# Patient Record
Sex: Female | Born: 1940
Health system: Southern US, Community
[De-identification: ages and names within clinical notes are randomized; demographics above are authoritative.]

## PROBLEM LIST (undated history)

## (undated) DIAGNOSIS — D219 Benign neoplasm of connective and other soft tissue, unspecified: Secondary | ICD-10-CM

## (undated) DIAGNOSIS — K635 Polyp of colon: Secondary | ICD-10-CM

## (undated) DIAGNOSIS — K5790 Diverticulosis of intestine, part unspecified, without perforation or abscess without bleeding: Secondary | ICD-10-CM

## (undated) DIAGNOSIS — D126 Benign neoplasm of colon, unspecified: Secondary | ICD-10-CM

## (undated) DIAGNOSIS — Z9889 Other specified postprocedural states: Secondary | ICD-10-CM

## (undated) DIAGNOSIS — R112 Nausea with vomiting, unspecified: Secondary | ICD-10-CM

## (undated) DIAGNOSIS — I639 Cerebral infarction, unspecified: Secondary | ICD-10-CM

## (undated) DIAGNOSIS — M810 Age-related osteoporosis without current pathological fracture: Secondary | ICD-10-CM

## (undated) HISTORY — DX: Age-related osteoporosis without current pathological fracture: M81.0

## (undated) HISTORY — PX: UTERINE FIBROID SURGERY: SHX826

## (undated) HISTORY — DX: Diverticulosis of intestine, part unspecified, without perforation or abscess without bleeding: K57.90

## (undated) HISTORY — PX: NASAL SEPTUM SURGERY: SHX37

## (undated) HISTORY — DX: Polyp of colon: K63.5

## (undated) HISTORY — PX: FACIAL COSMETIC SURGERY: SHX629

## (undated) HISTORY — DX: Cerebral infarction, unspecified: I63.9

## (undated) HISTORY — DX: Benign neoplasm of colon, unspecified: D12.6

## (undated) HISTORY — PX: SQUAMOUS CELL CARCINOMA EXCISION: SHX2433

---

## 1998-04-16 ENCOUNTER — Other Ambulatory Visit: Admission: RE | Admit: 1998-04-16 | Discharge: 1998-04-16 | Payer: Self-pay | Admitting: Obstetrics and Gynecology

## 1999-05-19 ENCOUNTER — Other Ambulatory Visit: Admission: RE | Admit: 1999-05-19 | Discharge: 1999-05-19 | Payer: Self-pay | Admitting: Obstetrics and Gynecology

## 2000-06-13 ENCOUNTER — Other Ambulatory Visit: Admission: RE | Admit: 2000-06-13 | Discharge: 2000-06-13 | Payer: Self-pay | Admitting: Obstetrics and Gynecology

## 2001-06-19 ENCOUNTER — Other Ambulatory Visit: Admission: RE | Admit: 2001-06-19 | Discharge: 2001-06-19 | Payer: Self-pay | Admitting: Obstetrics and Gynecology

## 2002-06-07 DIAGNOSIS — D126 Benign neoplasm of colon, unspecified: Secondary | ICD-10-CM

## 2002-06-07 DIAGNOSIS — K635 Polyp of colon: Secondary | ICD-10-CM

## 2002-06-07 HISTORY — DX: Benign neoplasm of colon, unspecified: D12.6

## 2002-06-07 HISTORY — DX: Polyp of colon: K63.5

## 2002-07-31 ENCOUNTER — Other Ambulatory Visit: Admission: RE | Admit: 2002-07-31 | Discharge: 2002-07-31 | Payer: Self-pay | Admitting: Obstetrics and Gynecology

## 2003-08-05 ENCOUNTER — Other Ambulatory Visit: Admission: RE | Admit: 2003-08-05 | Discharge: 2003-08-05 | Payer: Self-pay | Admitting: Obstetrics and Gynecology

## 2007-05-01 ENCOUNTER — Other Ambulatory Visit: Admission: RE | Admit: 2007-05-01 | Discharge: 2007-05-01 | Payer: Self-pay | Admitting: Internal Medicine

## 2008-07-29 ENCOUNTER — Other Ambulatory Visit: Admission: RE | Admit: 2008-07-29 | Discharge: 2008-07-29 | Payer: Self-pay | Admitting: Internal Medicine

## 2008-07-29 ENCOUNTER — Ambulatory Visit: Payer: Self-pay | Admitting: Internal Medicine

## 2010-01-30 ENCOUNTER — Encounter (INDEPENDENT_AMBULATORY_CARE_PROVIDER_SITE_OTHER): Payer: Self-pay | Admitting: *Deleted

## 2010-03-16 ENCOUNTER — Encounter (INDEPENDENT_AMBULATORY_CARE_PROVIDER_SITE_OTHER): Payer: Self-pay | Admitting: *Deleted

## 2010-03-18 ENCOUNTER — Ambulatory Visit: Payer: Self-pay | Admitting: Internal Medicine

## 2010-03-31 ENCOUNTER — Telehealth (INDEPENDENT_AMBULATORY_CARE_PROVIDER_SITE_OTHER): Payer: Self-pay | Admitting: *Deleted

## 2010-04-01 ENCOUNTER — Ambulatory Visit: Payer: Self-pay | Admitting: Internal Medicine

## 2010-07-07 NOTE — Letter (Signed)
Summary: [facility] Instructions  [facility]  [address] [place], [place] [address]   Phone: [phone]  Fax: [phone]       Vanessa Ford    January 30, 1941    MRN: [id number]       Procedure Day Dorna Bloom:  Vanessa Ford 04/01/2010     Arrival Time: 7:30 am     Procedure Time:  8:30 am     Location of Procedure:                    _ x_   Endoscopy Center (4th Floor)    PREPARATION FOR COLONOSCOPY WITH MIRALAX  Starting 5 days prior to your procedure Friday 10/21 do not eat nuts, seeds, popcorn, corn, beans, peas,  salads, or any raw vegetables.  Do not take any fiber supplements (e.g. Metamucil, Citrucel, and Benefiber). ____________________________________________________________________________________________________   THE DAY BEFORE YOUR PROCEDURE         DATE: Tuesday 10/25  1   Drink clear liquids the entire day-NO SOLID FOOD  2   Do not drink anything colored red or purple.  Avoid juices with pulp.  No orange juice.  3   Drink at least 64 oz. (8 glasses) of fluid/clear liquids during the day to prevent dehydration and help the prep work efficiently.  CLEAR LIQUIDS INCLUDE: Water Jello Ice Popsicles Tea (sugar ok, no milk/cream) Powdered fruit flavored drinks Coffee (sugar ok, no milk/cream) Gatorade Juice: apple, white grape, white cranberry  Lemonade Clear bullion, consomm, broth Carbonated beverages (any kind) Strained chicken noodle soup Hard Candy  4   Mix the entire bottle of Miralax with 64 oz. of Gatorade/Powerade in the morning and put in the refrigerator to chill.  5   At 3:00 pm take 2 Dulcolax/Bisacodyl tablets.  6   At 4:30 pm take one Reglan/Metoclopramide tablet.  7  Starting at 5:00 pm drink one 8 oz glass of the Miralax mixture every 15-20 minutes until you have finished drinking the entire 64 oz.  You should finish drinking prep around 7:30 or 8:00 pm.  8   If you are nauseated, you may take the 2nd Reglan/Metoclopramide  tablet at 6:30 pm.        9    At 8:00 pm take 2 more DULCOLAX/Bisacodyl tablets.     THE DAY OF YOUR PROCEDURE      DATE:  Wednesday 10/26  You may drink clear liquids until 6:30 am (2 HOURS BEFORE PROCEDURE).   MEDICATION INSTRUCTIONS  Unless otherwise instructed, you should take regular prescription medications with a small sip of water as early as possible the morning of your procedure.        OTHER INSTRUCTIONS  You will need a responsible adult at least 70 years of age to accompany you and drive you home.   This person must remain in the waiting room during your procedure.  Wear loose fitting clothing that is easily removed.  Leave jewelry and other valuables at home.  However, you may wish to bring a book to read or an iPod/MP3 player to listen to music as you wait for your procedure to start.  Remove all body piercing jewelry and leave at home.  Total time from sign-in until discharge is approximately 2-3 hours.  You should go home directly after your procedure and rest.  You can resume normal activities the day after your procedure.  The day of your procedure you should not:   Drive   Make legal  decisions   Operate machinery   Drink alcohol   Return to work  You will receive specific instructions about eating, activities and medications before you leave.   The above instructions have been reviewed and explained to me by   Wyona Almas RN  March 18, 2010 11:09 AM     I fully understand and can verbalize these instructions _____________________________ Date _______

## 2010-07-07 NOTE — Procedures (Signed)
Summary: Colonoscopy  Patient: Vanessa Ford Note: All result statuses are Final unless otherwise noted.  Tests: (1) Colonoscopy (COL)   COL Colonoscopy           DONE     [facility]     [address]     [place], [place]  [address]           COLONOSCOPY PROCEDURE REPORT           PATIENT:  [name], [name]  MR#:  [id number]     BIRTHDATE:  24-Jan-1941, 69 yrs. old  GENDER:  female     ENDOSCOPIST:  Hedwig Morton. Juanda Chance, MD     REF. BY:  Creola Corn, M.D.     PROCEDURE DATE:  04/01/2010     PROCEDURE:  Colonoscopy 34742     ASA CLASS:  Class II     INDICATIONS:  Elevated Risk Screening f hx of colon cancer,     adenom. polyp 2004     MEDICATIONS:   Versed 10 mg, Fentanyl 75 mcg           DESCRIPTION OF PROCEDURE:   After the risks benefits and     alternatives of the procedure were thoroughly explained, informed     consent was obtained.  Digital rectal exam was performed and     revealed no rectal masses.   The LB [id number]-[id number] endoscope     was introduced through the anus and advanced to the cecum, which     was identified by both the appendix and ileocecal valve, without     limitations.  The quality of the prep was excellent, using     MiraLax.  The instrument was then slowly withdrawn as the colon     was fully examined.     <<PROCEDUREIMAGES>>           FINDINGS:  Moderate diverticulosis was found (see image1 and     image7). tortuous sigmoid colon, difficult exam,  This was     otherwise a normal examination of the colon (see image8, image6,     image5, and image3).   Retroflexed views in the rectum revealed no     abnormalities.    The scope was then withdrawn from the patient     and the procedure completed.           COMPLICATIONS:  None     ENDOSCOPIC IMPRESSION:     1) Moderate diverticulosis     2) Otherwise normal examination     RECOMMENDATIONS:     1) high fiber diet     REPEAT EXAM:  In 5 year(s) for.           ______________________________     Hedwig Morton. Juanda Chance, MD           CC:           n.     eSIGNED:   Hedwig Morton. Brodie at 04/01/2010 09:33 AM           Alen Bleacher, [id number]  Note: An exclamation mark (!) indicates a result that was not dispersed into the flowsheet. Document Creation Date: 04/01/2010 9:33 AM _______________________________________________________________________  (1) Order result status: Final Collection or observation date-time: 04/01/2010 09:25 Requested date-time:  Receipt date-time:  Reported date-time:  Referring Physician:   Ordering Physician: Lina Sar [phone]) Specimen Source:  Source: Launa Grill Order Number: [id number] Lab site:   Appended Document: Colonoscopy    Clinical Lists  Changes  Observations: Added new observation of COLONNXTDUE: 03/2015 (04/01/2010 12:39)

## 2010-07-07 NOTE — Progress Notes (Signed)
Summary: prep ?'s  Phone Note Call from Patient Call back at Home Phone [phone]   Caller: Patient Call For: Dr. Juanda Chance Reason for Call: Talk to Nurse Summary of Call: prep ?'s Initial call taken by: Vallarie Mare,  March 31, 2010 4:36 PM  Follow-up for Phone Call        Pt had questions about times of prep meds. Questions answered. Follow-up by: Karl Bales RN,  March 31, 2010 4:38 PM

## 2010-07-07 NOTE — Miscellaneous (Signed)
Summary: [facility] Previsit/prep  Clinical Lists Changes  Medications: Added new medication of DULCOLAX 5 MG  TBEC (BISACODYL) Day before procedure take 2 at 3pm and 2 at 8pm. - Signed Added new medication of METOCLOPRAMIDE HCL 10 MG  TABS (METOCLOPRAMIDE HCL) As per prep instructions. - Signed Added new medication of MIRALAX   POWD (POLYETHYLENE GLYCOL 3350) As per prep  instructions. - Signed Rx of DULCOLAX 5 MG  TBEC (BISACODYL) Day before procedure take 2 at 3pm and 2 at 8pm.;  #4 x 0;  Signed;  Entered by: Wyona Almas RN;  Authorized by: Hart Carwin MD;  Method used: Electronically to [facility] [address] [place], [place]  [id number], Ph: [phone] or [phone], Fax: [phone] Rx of METOCLOPRAMIDE HCL 10 MG  TABS (METOCLOPRAMIDE HCL) As per prep instructions.;  #2 x 0;  Signed;  Entered by: Wyona Almas RN;  Authorized by: Hart Carwin MD;  Method used: Electronically to [facility] [address] [place], [place]  [id number], Ph: [phone] or [phone], Fax: [phone] Rx of MIRALAX   POWD (POLYETHYLENE GLYCOL 3350) As per prep  instructions.;  #255gm x 0;  Signed;  Entered by: Wyona Almas RN;  Authorized by: Hart Carwin MD;  Method used: Electronically to [facility] [address], [place], [place]  [id number], Ph: [phone] or [phone], Fax: [phone] Observations: Added new observation of NKA: T (03/18/2010 10:38)    Prescriptions: MIRALAX   POWD (POLYETHYLENE GLYCOL 3350) As per prep  instructions.  #255gm x 0   Entered by:   Wyona Almas RN   Authorized by:   Hart Carwin MD   Signed by:   Wyona Almas RN on 03/18/2010   Method used:   Electronically to        [facility] (retail)       [address]       [place], [place]  [id number]       Ph: [phone] or [phone]       Fax: [phone]   RxID:   [id number] METOCLOPRAMIDE HCL 10 MG  TABS (METOCLOPRAMIDE HCL) As per prep instructions.   #2 x 0   Entered by:   Wyona Almas RN   Authorized by:   Hart Carwin MD   Signed by:   Wyona Almas RN on 03/18/2010   Method used:   Electronically to        [facility] (retail)       [address]       [place], [place]  [id number]       Ph: [phone] or [phone]       Fax: [phone]   RxID:   [id number] DULCOLAX 5 MG  TBEC (BISACODYL) Day before procedure take 2 at 3pm and 2 at 8pm.  #4 x 0   Entered by:   Wyona Almas RN   Authorized by:   Hart Carwin MD   Signed by:   Wyona Almas RN on 03/18/2010   Method used:   Electronically to        [facility] (retail)       [address]       [place], [place]  [id number]       Ph: [phone] or [phone]       Fax: [phone]   RxID:   [id number]

## 2010-07-07 NOTE — Letter (Signed)
Summary: Previsit letter  [facility]  [address] [place], [place] [address]   Phone: [phone]  Fax: [phone]       01/30/2010 MRN: [id number]  Vanessa Ford [address] [place], [place]  [address]  Dear Vanessa Ford,  Welcome to the Gastroenterology Division at [facility].    You are scheduled to see a nurse for your pre-procedure visit on March 18, 2010 at 10:30am on the 3rd floor at [facility], [address]  We ask that you try to arrive at our office 15 minutes prior to your appointment time to allow for check-in.  Your nurse visit will consist of discussing your medical and surgical history, your immediate family medical history, and your medications.    Please bring a complete list of all your medications or, if you prefer, bring the medication bottles and we will list them.  We will need to be aware of both prescribed and over the counter drugs.  We will need to know exact dosage information as well.  If you are on blood thinners (Coumadin, Plavix, Aggrenox, Ticlid, etc.) please call our office today/prior to your appointment, as we need to consult with your physician about holding your medication.   Please be prepared to read and sign documents such as consent forms, a financial agreement, and acknowledgement forms.  If necessary, and with your consent, a friend or relative is welcome to sit-in on the nurse visit with you.  Please bring your insurance card so that we may make a copy of it.  If your insurance requires a referral to see a specialist, please bring your referral form from your primary care physician.  No co-pay is required for this nurse visit.     If you cannot keep your appointment, please call [phone] to cancel or reschedule prior to your appointment date.  This allows [place] the opportunity to schedule an appointment for another patient in need of care.    Thank you for choosing [facility] for your medical  needs.  We appreciate the opportunity to care for you.  Please visit [place] at our website  to learn more about our practice.                     Sincerely.                                                                                                                   The Gastroenterology Division

## 2011-02-25 ENCOUNTER — Telehealth: Payer: Self-pay | Admitting: *Deleted

## 2011-02-25 NOTE — Telephone Encounter (Signed)
Dr Juanda Chance got an email from patient requesting that a color copy of her colonoscopy report be sent to Dr Marin Roberts @ [facility]. I have mailed a colored copy to Dr Pamala Hurry ([address] [facility], [address] [place], [place] [address]) and have also mailed a colored copy of the report to the patient for her to keep on file. I have spoken to patient and have advised her of this. She verbalizes understanding.

## 2011-05-18 ENCOUNTER — Other Ambulatory Visit: Payer: Self-pay | Admitting: Dermatology

## 2011-08-23 DIAGNOSIS — D239 Other benign neoplasm of skin, unspecified: Secondary | ICD-10-CM | POA: Diagnosis not present

## 2011-08-23 DIAGNOSIS — L821 Other seborrheic keratosis: Secondary | ICD-10-CM | POA: Diagnosis not present

## 2011-08-23 DIAGNOSIS — Z85828 Personal history of other malignant neoplasm of skin: Secondary | ICD-10-CM | POA: Diagnosis not present

## 2011-08-23 DIAGNOSIS — L570 Actinic keratosis: Secondary | ICD-10-CM | POA: Diagnosis not present

## 2012-04-11 DIAGNOSIS — Z23 Encounter for immunization: Secondary | ICD-10-CM | POA: Diagnosis not present

## 2012-05-22 DIAGNOSIS — Z139 Encounter for screening, unspecified: Secondary | ICD-10-CM | POA: Diagnosis not present

## 2012-05-22 DIAGNOSIS — E785 Hyperlipidemia, unspecified: Secondary | ICD-10-CM | POA: Diagnosis not present

## 2012-05-22 DIAGNOSIS — I10 Essential (primary) hypertension: Secondary | ICD-10-CM | POA: Diagnosis not present

## 2012-05-22 DIAGNOSIS — Z1231 Encounter for screening mammogram for malignant neoplasm of breast: Secondary | ICD-10-CM | POA: Diagnosis not present

## 2012-05-22 DIAGNOSIS — Z0000 Encounter for general adult medical examination without abnormal findings: Secondary | ICD-10-CM | POA: Diagnosis not present

## 2012-05-22 DIAGNOSIS — R7309 Other abnormal glucose: Secondary | ICD-10-CM | POA: Diagnosis not present

## 2012-05-25 ENCOUNTER — Telehealth: Payer: Self-pay | Admitting: Internal Medicine

## 2012-05-25 ENCOUNTER — Encounter: Payer: Self-pay | Admitting: Internal Medicine

## 2012-05-25 ENCOUNTER — Encounter: Payer: Self-pay | Admitting: *Deleted

## 2012-05-25 MED ORDER — METRONIDAZOLE 250 MG PO TABS: 250.0000 mg | ORAL_TABLET | Freq: Three times a day (TID) | ORAL | Status: AC

## 2012-05-25 NOTE — Telephone Encounter (Signed)
Rx sent. Patient aware.  

## 2012-05-25 NOTE — Telephone Encounter (Signed)
I have talked to [place]. She will keep the appointment. Please sent to [facility] Flagyl 250 mg, #21, 1 po tid.,no refill

## 2012-05-25 NOTE — Telephone Encounter (Signed)
Last Friday, she ate a salad at [employer]. That afternoon, she had stomach cramps bowel movement and lots of gas. Friday night, she had diarrhea. Since this meal, every time she eats, she has a bowel movement and lot of gas. The bowel movements are mostly mucous with a little stool. She was on antibiotics 2 months ago. Scheduled patient with Dr. Juanda Chance at 2:45 PM tomorrow.

## 2012-05-26 ENCOUNTER — Encounter: Payer: Self-pay | Admitting: Internal Medicine

## 2012-05-26 ENCOUNTER — Ambulatory Visit (INDEPENDENT_AMBULATORY_CARE_PROVIDER_SITE_OTHER): Payer: Medicare Other | Admitting: Internal Medicine

## 2012-05-26 VITALS — BP 154/90 | HR 80 | Ht 67.5 in | Wt 145.1 lb

## 2012-05-26 DIAGNOSIS — R195 Other fecal abnormalities: Secondary | ICD-10-CM

## 2012-05-26 DIAGNOSIS — K5289 Other specified noninfective gastroenteritis and colitis: Secondary | ICD-10-CM | POA: Diagnosis not present

## 2012-05-26 NOTE — Patient Instructions (Addendum)
We have given you samples of Align. This puts good bacteria back into your colon. You should take 1 capsule by mouth once daily. If this works well for you, it can be purchased over the counter. CC: Dr Timothy Lasso

## 2012-05-26 NOTE — Progress Notes (Signed)
Vanessa Ford 06-Sep-1940 MRN [id number]        History of Present Illness:  This is a 71 year old white female with acute gastroenteritis which started about a week ago with the bloating, diarrhea and dyspepsia. It has continued through the week and she called yesterday and we have started Flagyl 250 mg 3 times a day. She has taken so far 4  pills and is much improved today. She had a formed bowel movement this morning, she also started taking probiotic. She has stayed on low fiber diet to except this morning had the salad and blueberries There is history of  adenomatous colon polyp on colonoscopy in 2004. Most recent colonoscopy in October 2011 showed moderately severe diverticulosis and tortuous and narrow:. It was a difficult exam.   Past Medical History  Diagnosis Date  . Tubular adenoma of colon 2004  . Hyperplastic colon polyp 2004  . Diverticulosis   . Osteoporosis    Past Surgical History  Procedure Date  . Facial cosmetic surgery   . Nasal septum surgery   . Squamous cell carcinoma excision     face, hand    reports that she quit smoking about 32 years ago. Her smoking use included Cigarettes. She has never used smokeless tobacco. She reports that she drinks alcohol. She reports that she does not use illicit drugs. family history includes Cirrhosis in her father; Colon cancer in her mother; Heart attack in her maternal grandmother; and Stroke in her paternal grandmother. No Known Allergies      Review of Systems: Small amount of blood per rectum this morning. Denies abdominal pain or weight loss  The remainder of the 10 point ROS is negative except as outlined in H&P   Physical Exam: General appearance  Well developed, in no distress. Eyes- non icteric. HEENT nontraumatic, normocephalic. Mouth no lesions, tongue papillated, no cheilosis. Neck supple without adenopathy, thyroid not enlarged, no carotid bruits, no JVD. Lungs Clear to auscultation bilaterally. Cor  normal S1, normal S2, regular rhythm, no murmur,  quiet precordium. Abdomen: Soft nontender with hyperactive bowel sounds and rushes. No tympany. Rectal: Heme positive stool Extremities no pedal edema. Skin no lesions. Neurological alert and oriented x 3. Psychological normal mood and affect.  Assessment and Plan:  Problem #1 Acute gastroenteritis, most likely infectious. She is much improved since Flagyl was started 24 hours ago. She will continue a low fiber diet, Flagyl 250 mg by mouth 3 times a day and probiotics daily. If symptoms don't improve, I would consider either a flexible sigmoidoscopy or CT scan of the abdomen to look for colitis. Ischemic colitis is a possibility although less likely since she has never had any significant pain.  Problem #2 Moderately severe diverticulosis of the left colon without evidence of diverticulitis. She has a history of adenomatous polyps of the colon. Her last colonoscopy was in 2011 and her next colonoscopy will be due in 2016.   05/26/2012 Vanessa Ford

## 2012-08-29 DIAGNOSIS — H2640 Unspecified secondary cataract: Secondary | ICD-10-CM | POA: Diagnosis not present

## 2012-10-02 DIAGNOSIS — M20019 Mallet finger of unspecified finger(s): Secondary | ICD-10-CM | POA: Diagnosis not present

## 2012-10-13 ENCOUNTER — Encounter: Payer: Self-pay | Admitting: Nurse Practitioner

## 2012-10-13 ENCOUNTER — Telehealth: Payer: Self-pay | Admitting: Internal Medicine

## 2012-10-13 ENCOUNTER — Ambulatory Visit (INDEPENDENT_AMBULATORY_CARE_PROVIDER_SITE_OTHER): Payer: Medicare Other | Admitting: Nurse Practitioner

## 2012-10-13 VITALS — BP 136/84 | HR 72 | Ht 67.5 in | Wt 144.6 lb

## 2012-10-13 DIAGNOSIS — R1310 Dysphagia, unspecified: Secondary | ICD-10-CM | POA: Insufficient documentation

## 2012-10-13 MED ORDER — SUCRALFATE 1 GM/10ML PO SUSP: 1.0000 g | Freq: Four times a day (QID) | ORAL | Status: DC

## 2012-10-13 MED ORDER — LIDOCAINE VISCOUS 2 % MT SOLN: OROMUCOSAL | Status: DC

## 2012-10-13 MED ORDER — ESOMEPRAZOLE MAGNESIUM 40 MG PO CPDR: 40.0000 mg | DELAYED_RELEASE_CAPSULE | Freq: Every day | ORAL | Status: DC

## 2012-10-13 NOTE — Patient Instructions (Addendum)
We have given you samples of Carafate, take 1 cup of the liquid by mouth 4 times daily until samples gone. We have given you samples of Nexium 40 mg, take 1 cap Am.  We sent a prescription for Viscous lidocaine , take 5 cc 4 times daily. ( swallow the liquid).   Hold the doxycycline.

## 2012-10-13 NOTE — Progress Notes (Signed)
  History of Present Illness:  Vanessa Ford is a 72 year old female known to Dr. Juanda Chance. She has a history of diverticulosis and adenomatous colon polyps. Patient presents with acute odynophagia. She has been taking Doxycycline. A couple of nights ago she took Doxycycline on empty stomach, late at night and went to bed. She woke up with odynophagia  Current Medications, Allergies, Past Medical History, Past Surgical History, Family History and Social History were reviewed in [employer] record.   Physical Exam: General: Well developed , white female in no acute distress Head: Normocephalic and atraumatic. Mouth: no exudate. Eyes:  sclerae anicteric, conjunctiva pink  Ears: Normal auditory acuity Lungs: Clear throughout to auscultation Heart: Regular rate and rhythm Abdomen: Soft, non distended, non-tender. No masses, no hepatomegaly. Normal bowel sounds Musculoskeletal: Symmetrical with no gross deformities  Extremities: No edema  Neurological: Alert oriented x 4, grossly nonfocal Psychological:  Alert and cooperative. Normal mood and affect  Assessment and Recommendations:   Odynphagia, likely pill induced esophagitis related to Doxycycline. Carafate QID (samples given). Daily PPI for 2 weeks (samples given) and Viscous lidocaine 5cc QID prn. Call us if not better in a few days. Hold Doxycycline.

## 2012-10-13 NOTE — Telephone Encounter (Signed)
Patient states she ate a late dinner on Wednesday night and during the night she was awake with acid burps. State she has not felt well since then. States she has had burning, pain below breast area. Did not eat much yesterday. Offered OV with extender to day at 2:30 PM. Patient understands to go to ED if she feels she cannot wait until OV.

## 2012-10-16 NOTE — Progress Notes (Signed)
reviewewd and agree.

## 2012-10-31 DIAGNOSIS — M20019 Mallet finger of unspecified finger(s): Secondary | ICD-10-CM | POA: Diagnosis not present

## 2012-11-28 DIAGNOSIS — M20019 Mallet finger of unspecified finger(s): Secondary | ICD-10-CM | POA: Diagnosis not present

## 2013-01-16 ENCOUNTER — Telehealth: Payer: Self-pay | Admitting: *Deleted

## 2013-01-16 NOTE — Telephone Encounter (Signed)
Per Dr. Juanda Chance, schedule Ov next week for  Hemorrhoids. Scheduled on 01/23/13 at 10:30 AM. Left a message for patient to call me.

## 2013-01-16 NOTE — Telephone Encounter (Signed)
Left a message at cell number also.

## 2013-01-17 NOTE — Telephone Encounter (Signed)
Spoke with patient and gave her appointment date and time. 

## 2013-01-23 ENCOUNTER — Ambulatory Visit (INDEPENDENT_AMBULATORY_CARE_PROVIDER_SITE_OTHER): Payer: Medicare Other | Admitting: Internal Medicine

## 2013-01-23 ENCOUNTER — Encounter: Payer: Self-pay | Admitting: Internal Medicine

## 2013-01-23 VITALS — BP 128/82 | HR 80 | Ht 67.5 in | Wt 145.8 lb

## 2013-01-23 DIAGNOSIS — R1011 Right upper quadrant pain: Secondary | ICD-10-CM

## 2013-01-23 NOTE — Patient Instructions (Addendum)
CC: Dr Creola Corn

## 2013-01-23 NOTE — Progress Notes (Signed)
Vanessa Ford Oct 18, 1940 MRN [id number]        History of Present Illness:  This is a 72 year old, white female with a several week history of tender spot below  right costal margin localized anteriorly not associated with any bowel activity or eating. It is brought on by certain exercises like this morning in the gym. She is worried about this being diverticulitis. She has a positive family history of colon cancer in a parent and had a tubular adenoma in 2004 as well as hyperplastic polyps. Most recent colonoscopy in October 2011 showed moderately severe diverticulosis. It was a difficult exam due to tortuous colon. She has history of thrombosed hemorrhoid. She was recently seen for a doxycycline induced odynophagia in May 2014. There is no family history of gallbladder disease. Her diet has consisted of low-fat high-fiber foods. Her weight has been stable. She has occasional dyspepsia   Past Medical History  Diagnosis Date  . Tubular adenoma of colon 2004  . Hyperplastic colon polyp 2004  . Diverticulosis   . Osteoporosis    Past Surgical History  Procedure Laterality Date  . Facial cosmetic surgery    . Nasal septum surgery    . Squamous cell carcinoma excision      face, hand    reports that she quit smoking about 33 years ago. Her smoking use included Cigarettes. She smoked 0.00 packs per day. She has never used smokeless tobacco. She reports that  drinks alcohol. She reports that she does not use illicit drugs. family history includes Cirrhosis in her father; Colon cancer in her mother; Heart attack in her maternal grandmother; Stroke in her paternal grandmother. No Known Allergies      Review of Systems: Negative for dysphagia chest pain nausea  The remainder of the 10 point ROS is negative except as outlined in H&P   Physical Exam: General appearance  Well developed, in no distress. Eyes- non icteric. HEENT nontraumatic, normocephalic. Mouth no lesions, tongue  papillated, no cheilosis. Neck supple without adenopathy, thyroid not enlarged, no carotid bruits, no JVD. Lungs Clear to auscultation bilaterally. Cor normal S1, normal S2, regular rhythm, no murmur,  quiet precordium. Abdomen: Scaphoid, soft. In standing position as well as in lying down position there is no hernia. There is a circular area of 3 cm in diameter the lower right costal margin medially which is somewhat tender. It is not pulsating and does not extends to epigastrium onto the right lower quadrant rib cage is not tender. The left upper and lower quadrants are unremarkable. I cannot palpate any mass or stools in the right colon. There is no CVA tenderness Rectal: Not done Extremities no pedal edema. Skin no lesions. Neurological alert and oriented x 3. Psychological normal mood and affect.  Assessment and Plan:  72 year old white female with new onset tenderness and mild to discomfort along the right costal margin which is of unclear etiology. It could be at abdominal wall pain since it seemed to be worse with certain exercises. Gallbladder dysfunction would be another possibility and a gastritis or hyperacidity would be another possibility. I assured her that the symptoms are not suggestive of diverticulitis. She is not interested in any extensive workup. I have given her samples of Zegerid 40 mg to take once a day for next 7 days. Sh or we will e is going to the beach for 2 weeks and she will let us know when she comes back and we would consider ultrasound of the right upper  quadrant  Family history of colon cancer and personal history of adenomatous polyp. She is up-to-date on her colonoscopy. Next exam October 2016   01/23/2013 Lina Sar And a

## 2013-02-12 DIAGNOSIS — L578 Other skin changes due to chronic exposure to nonionizing radiation: Secondary | ICD-10-CM | POA: Diagnosis not present

## 2013-02-12 DIAGNOSIS — I789 Disease of capillaries, unspecified: Secondary | ICD-10-CM | POA: Diagnosis not present

## 2013-02-12 DIAGNOSIS — L570 Actinic keratosis: Secondary | ICD-10-CM | POA: Diagnosis not present

## 2013-02-12 DIAGNOSIS — Z85828 Personal history of other malignant neoplasm of skin: Secondary | ICD-10-CM | POA: Diagnosis not present

## 2013-02-12 DIAGNOSIS — L719 Rosacea, unspecified: Secondary | ICD-10-CM | POA: Diagnosis not present

## 2013-03-29 DIAGNOSIS — Z23 Encounter for immunization: Secondary | ICD-10-CM | POA: Diagnosis not present

## 2013-04-12 ENCOUNTER — Other Ambulatory Visit: Payer: Self-pay

## 2013-08-09 DIAGNOSIS — L570 Actinic keratosis: Secondary | ICD-10-CM | POA: Diagnosis not present

## 2013-08-09 DIAGNOSIS — Z85828 Personal history of other malignant neoplasm of skin: Secondary | ICD-10-CM | POA: Diagnosis not present

## 2013-08-09 DIAGNOSIS — L821 Other seborrheic keratosis: Secondary | ICD-10-CM | POA: Diagnosis not present

## 2013-09-04 DIAGNOSIS — H0289 Other specified disorders of eyelid: Secondary | ICD-10-CM | POA: Diagnosis not present

## 2013-09-05 DIAGNOSIS — R918 Other nonspecific abnormal finding of lung field: Secondary | ICD-10-CM | POA: Diagnosis not present

## 2013-09-05 DIAGNOSIS — Z79899 Other long term (current) drug therapy: Secondary | ICD-10-CM | POA: Diagnosis not present

## 2013-09-05 DIAGNOSIS — R7309 Other abnormal glucose: Secondary | ICD-10-CM | POA: Diagnosis not present

## 2013-09-05 DIAGNOSIS — Z23 Encounter for immunization: Secondary | ICD-10-CM | POA: Diagnosis not present

## 2013-09-05 DIAGNOSIS — N951 Menopausal and female climacteric states: Secondary | ICD-10-CM | POA: Diagnosis not present

## 2013-09-05 DIAGNOSIS — Z1231 Encounter for screening mammogram for malignant neoplasm of breast: Secondary | ICD-10-CM | POA: Diagnosis not present

## 2013-09-05 DIAGNOSIS — E785 Hyperlipidemia, unspecified: Secondary | ICD-10-CM | POA: Diagnosis not present

## 2013-09-05 DIAGNOSIS — I10 Essential (primary) hypertension: Secondary | ICD-10-CM | POA: Diagnosis not present

## 2013-09-05 DIAGNOSIS — M899 Disorder of bone, unspecified: Secondary | ICD-10-CM | POA: Diagnosis not present

## 2013-09-05 DIAGNOSIS — Z136 Encounter for screening for cardiovascular disorders: Secondary | ICD-10-CM | POA: Diagnosis not present

## 2014-03-05 DIAGNOSIS — H2640 Unspecified secondary cataract: Secondary | ICD-10-CM | POA: Diagnosis not present

## 2014-04-02 DIAGNOSIS — Z23 Encounter for immunization: Secondary | ICD-10-CM | POA: Diagnosis not present

## 2014-06-20 DIAGNOSIS — M810 Age-related osteoporosis without current pathological fracture: Secondary | ICD-10-CM | POA: Diagnosis not present

## 2014-06-20 DIAGNOSIS — L719 Rosacea, unspecified: Secondary | ICD-10-CM | POA: Diagnosis not present

## 2014-06-20 DIAGNOSIS — T148 Other injury of unspecified body region: Secondary | ICD-10-CM | POA: Diagnosis not present

## 2014-06-20 DIAGNOSIS — N958 Other specified menopausal and perimenopausal disorders: Secondary | ICD-10-CM | POA: Diagnosis not present

## 2014-06-20 DIAGNOSIS — M419 Scoliosis, unspecified: Secondary | ICD-10-CM | POA: Diagnosis not present

## 2014-06-20 DIAGNOSIS — Z6822 Body mass index (BMI) 22.0-22.9, adult: Secondary | ICD-10-CM | POA: Diagnosis not present

## 2014-06-20 DIAGNOSIS — R030 Elevated blood-pressure reading, without diagnosis of hypertension: Secondary | ICD-10-CM | POA: Diagnosis not present

## 2014-06-20 DIAGNOSIS — G43709 Chronic migraine without aura, not intractable, without status migrainosus: Secondary | ICD-10-CM | POA: Diagnosis not present

## 2014-06-20 DIAGNOSIS — K635 Polyp of colon: Secondary | ICD-10-CM | POA: Diagnosis not present

## 2014-06-20 DIAGNOSIS — Z1389 Encounter for screening for other disorder: Secondary | ICD-10-CM | POA: Diagnosis not present

## 2014-06-20 DIAGNOSIS — I4510 Unspecified right bundle-branch block: Secondary | ICD-10-CM | POA: Diagnosis not present

## 2014-09-25 DIAGNOSIS — Z79899 Other long term (current) drug therapy: Secondary | ICD-10-CM | POA: Diagnosis not present

## 2014-09-25 DIAGNOSIS — E780 Pure hypercholesterolemia: Secondary | ICD-10-CM | POA: Diagnosis not present

## 2014-09-25 DIAGNOSIS — F439 Reaction to severe stress, unspecified: Secondary | ICD-10-CM | POA: Diagnosis not present

## 2014-09-25 DIAGNOSIS — R9431 Abnormal electrocardiogram [ECG] [EKG]: Secondary | ICD-10-CM | POA: Diagnosis not present

## 2014-09-25 DIAGNOSIS — R7309 Other abnormal glucose: Secondary | ICD-10-CM | POA: Diagnosis not present

## 2014-09-25 DIAGNOSIS — Z1231 Encounter for screening mammogram for malignant neoplasm of breast: Secondary | ICD-10-CM | POA: Diagnosis not present

## 2014-09-25 DIAGNOSIS — R030 Elevated blood-pressure reading, without diagnosis of hypertension: Secondary | ICD-10-CM | POA: Diagnosis not present

## 2014-09-25 DIAGNOSIS — Z136 Encounter for screening for cardiovascular disorders: Secondary | ICD-10-CM | POA: Diagnosis not present

## 2014-09-25 DIAGNOSIS — C4492 Squamous cell carcinoma of skin, unspecified: Secondary | ICD-10-CM | POA: Diagnosis not present

## 2014-09-25 DIAGNOSIS — Z800 Family history of malignant neoplasm of digestive organs: Secondary | ICD-10-CM | POA: Diagnosis not present

## 2014-09-25 DIAGNOSIS — M810 Age-related osteoporosis without current pathological fracture: Secondary | ICD-10-CM | POA: Diagnosis not present

## 2014-09-25 DIAGNOSIS — Z780 Asymptomatic menopausal state: Secondary | ICD-10-CM | POA: Diagnosis not present

## 2014-09-25 DIAGNOSIS — Z8781 Personal history of (healed) traumatic fracture: Secondary | ICD-10-CM | POA: Diagnosis not present

## 2014-09-25 DIAGNOSIS — Z801 Family history of malignant neoplasm of trachea, bronchus and lung: Secondary | ICD-10-CM | POA: Diagnosis not present

## 2014-12-02 ENCOUNTER — Other Ambulatory Visit: Payer: Self-pay

## 2015-03-07 DIAGNOSIS — J029 Acute pharyngitis, unspecified: Secondary | ICD-10-CM | POA: Diagnosis not present

## 2015-03-07 DIAGNOSIS — Z6821 Body mass index (BMI) 21.0-21.9, adult: Secondary | ICD-10-CM | POA: Diagnosis not present

## 2015-03-13 ENCOUNTER — Encounter: Payer: Self-pay | Admitting: Internal Medicine

## 2015-03-26 ENCOUNTER — Encounter: Payer: Self-pay | Admitting: Gastroenterology

## 2015-03-31 DIAGNOSIS — K6289 Other specified diseases of anus and rectum: Secondary | ICD-10-CM | POA: Diagnosis not present

## 2015-03-31 DIAGNOSIS — D124 Benign neoplasm of descending colon: Secondary | ICD-10-CM | POA: Diagnosis not present

## 2015-03-31 DIAGNOSIS — Z86010 Personal history of colonic polyps: Secondary | ICD-10-CM | POA: Diagnosis not present

## 2015-03-31 DIAGNOSIS — D122 Benign neoplasm of ascending colon: Secondary | ICD-10-CM | POA: Diagnosis not present

## 2015-03-31 DIAGNOSIS — Z800 Family history of malignant neoplasm of digestive organs: Secondary | ICD-10-CM | POA: Diagnosis not present

## 2015-03-31 DIAGNOSIS — K5730 Diverticulosis of large intestine without perforation or abscess without bleeding: Secondary | ICD-10-CM | POA: Diagnosis not present

## 2015-03-31 DIAGNOSIS — D126 Benign neoplasm of colon, unspecified: Secondary | ICD-10-CM | POA: Diagnosis not present

## 2015-03-31 DIAGNOSIS — D125 Benign neoplasm of sigmoid colon: Secondary | ICD-10-CM | POA: Diagnosis not present

## 2015-04-01 ENCOUNTER — Telehealth: Payer: Self-pay | Admitting: Gastroenterology

## 2015-04-01 NOTE — Telephone Encounter (Signed)
Patient called office to advice she will not be coming back. Transfering her GI care to another practice.

## 2015-05-13 DIAGNOSIS — Z23 Encounter for immunization: Secondary | ICD-10-CM | POA: Diagnosis not present

## 2015-05-16 DIAGNOSIS — N390 Urinary tract infection, site not specified: Secondary | ICD-10-CM | POA: Diagnosis not present

## 2015-05-16 DIAGNOSIS — R300 Dysuria: Secondary | ICD-10-CM | POA: Diagnosis not present

## 2015-05-16 DIAGNOSIS — R8290 Unspecified abnormal findings in urine: Secondary | ICD-10-CM | POA: Diagnosis not present

## 2015-05-22 DIAGNOSIS — C44722 Squamous cell carcinoma of skin of right lower limb, including hip: Secondary | ICD-10-CM | POA: Diagnosis not present

## 2015-05-22 DIAGNOSIS — Z85828 Personal history of other malignant neoplasm of skin: Secondary | ICD-10-CM | POA: Diagnosis not present

## 2015-05-22 DIAGNOSIS — D485 Neoplasm of uncertain behavior of skin: Secondary | ICD-10-CM | POA: Diagnosis not present

## 2015-05-22 DIAGNOSIS — L821 Other seborrheic keratosis: Secondary | ICD-10-CM | POA: Diagnosis not present

## 2015-09-05 DIAGNOSIS — M25561 Pain in right knee: Secondary | ICD-10-CM | POA: Diagnosis not present

## 2015-11-18 DIAGNOSIS — L812 Freckles: Secondary | ICD-10-CM | POA: Diagnosis not present

## 2015-11-18 DIAGNOSIS — Z85828 Personal history of other malignant neoplasm of skin: Secondary | ICD-10-CM | POA: Diagnosis not present

## 2015-11-18 DIAGNOSIS — L821 Other seborrheic keratosis: Secondary | ICD-10-CM | POA: Diagnosis not present

## 2015-11-20 DIAGNOSIS — S8001XD Contusion of right knee, subsequent encounter: Secondary | ICD-10-CM | POA: Diagnosis not present

## 2016-02-17 ENCOUNTER — Emergency Department (HOSPITAL_COMMUNITY): Payer: Medicare Other

## 2016-02-17 ENCOUNTER — Inpatient Hospital Stay (HOSPITAL_COMMUNITY)
Admission: EM | Admit: 2016-02-17 | Discharge: 2016-02-19 | DRG: 066 | Disposition: A | Discharge status: Home / Self Care | Payer: Medicare Other | Attending: Internal Medicine | Admitting: Internal Medicine

## 2016-02-17 ENCOUNTER — Inpatient Hospital Stay (HOSPITAL_COMMUNITY)
Admit: 2016-02-17 | Discharge: 2016-02-17 | Disposition: A | Discharge status: Home / Self Care | Payer: Medicare Other | Attending: Internal Medicine | Admitting: Internal Medicine

## 2016-02-17 ENCOUNTER — Encounter (HOSPITAL_COMMUNITY): Payer: Self-pay | Admitting: Nurse Practitioner

## 2016-02-17 DIAGNOSIS — E876 Hypokalemia: Secondary | ICD-10-CM | POA: Diagnosis not present

## 2016-02-17 DIAGNOSIS — I6381 Other cerebral infarction due to occlusion or stenosis of small artery: Secondary | ICD-10-CM | POA: Diagnosis present

## 2016-02-17 DIAGNOSIS — Z800 Family history of malignant neoplasm of digestive organs: Secondary | ICD-10-CM | POA: Diagnosis not present

## 2016-02-17 DIAGNOSIS — I63332 Cerebral infarction due to thrombosis of left posterior cerebral artery: Secondary | ICD-10-CM | POA: Diagnosis not present

## 2016-02-17 DIAGNOSIS — Z79899 Other long term (current) drug therapy: Secondary | ICD-10-CM

## 2016-02-17 DIAGNOSIS — Z8719 Personal history of other diseases of the digestive system: Secondary | ICD-10-CM

## 2016-02-17 DIAGNOSIS — I639 Cerebral infarction, unspecified: Secondary | ICD-10-CM | POA: Diagnosis not present

## 2016-02-17 DIAGNOSIS — R29700 NIHSS score 0: Secondary | ICD-10-CM | POA: Diagnosis present

## 2016-02-17 DIAGNOSIS — M810 Age-related osteoporosis without current pathological fracture: Secondary | ICD-10-CM | POA: Diagnosis present

## 2016-02-17 DIAGNOSIS — E785 Hyperlipidemia, unspecified: Secondary | ICD-10-CM

## 2016-02-17 DIAGNOSIS — I10 Essential (primary) hypertension: Secondary | ICD-10-CM | POA: Diagnosis present

## 2016-02-17 DIAGNOSIS — Z87891 Personal history of nicotine dependence: Secondary | ICD-10-CM

## 2016-02-17 DIAGNOSIS — I6789 Other cerebrovascular disease: Secondary | ICD-10-CM | POA: Diagnosis not present

## 2016-02-17 DIAGNOSIS — R079 Chest pain, unspecified: Secondary | ICD-10-CM | POA: Diagnosis not present

## 2016-02-17 DIAGNOSIS — Z888 Allergy status to other drugs, medicaments and biological substances status: Secondary | ICD-10-CM

## 2016-02-17 DIAGNOSIS — E041 Nontoxic single thyroid nodule: Secondary | ICD-10-CM | POA: Diagnosis present

## 2016-02-17 DIAGNOSIS — Z91048 Other nonmedicinal substance allergy status: Secondary | ICD-10-CM | POA: Diagnosis not present

## 2016-02-17 DIAGNOSIS — I6621 Occlusion and stenosis of right posterior cerebral artery: Secondary | ICD-10-CM | POA: Diagnosis not present

## 2016-02-17 HISTORY — DX: Benign neoplasm of connective and other soft tissue, unspecified: D21.9

## 2016-02-17 HISTORY — DX: Nausea with vomiting, unspecified: R11.2

## 2016-02-17 HISTORY — DX: Other specified postprocedural states: Z98.890

## 2016-02-17 LAB — CBC
HCT: 47.4 % — ABNORMAL HIGH (ref 36.0–46.0)
Hemoglobin: 16.1 g/dL — ABNORMAL HIGH (ref 12.0–15.0)
MCH: 30.3 pg (ref 26.0–34.0)
MCHC: 34 g/dL (ref 30.0–36.0)
MCV: 89.3 fL (ref 78.0–100.0)
PLATELETS: 205 10*3/uL (ref 150–400)
RBC: 5.31 MIL/uL — ABNORMAL HIGH (ref 3.87–5.11)
RDW: 12.9 % (ref 11.5–15.5)
WBC: 8.5 10*3/uL (ref 4.0–10.5)

## 2016-02-17 LAB — COMPREHENSIVE METABOLIC PANEL
ALT: 20 U/L (ref 14–54)
ANION GAP: 10 (ref 5–15)
AST: 24 U/L (ref 15–41)
Albumin: 4.4 g/dL (ref 3.5–5.0)
Alkaline Phosphatase: 62 U/L (ref 38–126)
BUN: 12 mg/dL (ref 6–20)
CALCIUM: 9.7 mg/dL (ref 8.9–10.3)
CHLORIDE: 108 mmol/L (ref 101–111)
CO2: 24 mmol/L (ref 22–32)
Creatinine, Ser: 0.57 mg/dL (ref 0.44–1.00)
GFR calc non Af Amer: >60 mL/min (ref >60)
Glucose, Bld: 116 mg/dL — ABNORMAL HIGH (ref 65–99)
POTASSIUM: 3.5 mmol/L (ref 3.5–5.1)
SODIUM: 142 mmol/L (ref 135–145)
Total Bilirubin: 1.1 mg/dL (ref 0.3–1.2)
Total Protein: 7.3 g/dL (ref 6.5–8.1)

## 2016-02-17 LAB — URINE MICROSCOPIC-ADD ON

## 2016-02-17 LAB — DIFFERENTIAL
BASOS PCT: 0 %
Basophils Absolute: 0 10*3/uL (ref 0.0–0.1)
EOS PCT: 0 %
Eosinophils Absolute: 0 10*3/uL (ref 0.0–0.7)
Lymphocytes Relative: 16 %
Lymphs Abs: 1.3 10*3/uL (ref 0.7–4.0)
MONO ABS: 0.7 10*3/uL (ref 0.1–1.0)
Monocytes Relative: 8 %
Neutro Abs: 6.5 10*3/uL (ref 1.7–7.7)
Neutrophils Relative %: 76 %

## 2016-02-17 LAB — VAS US CAROTID
LCCADSYS: 97 cm/s
LEFT ECA DIAS: 7 cm/s
LEFT VERTEBRAL DIAS: 16 cm/s
LICAPDIAS: -14 cm/s
Left CCA dist dias: 22 cm/s
Left CCA prox dias: 17 cm/s
Left CCA prox sys: 88 cm/s
Left ICA dist dias: -13 cm/s
Left ICA dist sys: -61 cm/s
Left ICA prox sys: -94 cm/s
RCCADSYS: -71 cm/s
RCCAPDIAS: 15 cm/s
RIGHT ECA DIAS: -9 cm/s
RIGHT VERTEBRAL DIAS: 9 cm/s
Right CCA prox sys: 93 cm/s

## 2016-02-17 LAB — I-STAT CHEM 8, ED
BUN: 11 mg/dL (ref 6–20)
CALCIUM ION: 1.19 mmol/L (ref 1.15–1.40)
Chloride: 104 mmol/L (ref 101–111)
Creatinine, Ser: 0.6 mg/dL (ref 0.44–1.00)
GLUCOSE: 118 mg/dL — AB (ref 65–99)
HCT: 48 % — ABNORMAL HIGH (ref 36.0–46.0)
HEMOGLOBIN: 16.3 g/dL — AB (ref 12.0–15.0)
POTASSIUM: 3.4 mmol/L — AB (ref 3.5–5.1)
SODIUM: 142 mmol/L (ref 135–145)
TCO2: 25 mmol/L (ref 0–100)

## 2016-02-17 LAB — RAPID URINE DRUG SCREEN, HOSP PERFORMED
AMPHETAMINES: NOT DETECTED
BENZODIAZEPINES: NOT DETECTED
Barbiturates: NOT DETECTED
Cocaine: NOT DETECTED
OPIATES: NOT DETECTED
Tetrahydrocannabinol: NOT DETECTED

## 2016-02-17 LAB — URINALYSIS, ROUTINE W REFLEX MICROSCOPIC
BILIRUBIN URINE: NEGATIVE
Glucose, UA: NEGATIVE mg/dL
Hgb urine dipstick: NEGATIVE
Ketones, ur: NEGATIVE mg/dL
NITRITE: NEGATIVE
PH: 6.5 (ref 5.0–8.0)
Protein, ur: NEGATIVE mg/dL
SPECIFIC GRAVITY, URINE: 1.009 (ref 1.005–1.030)

## 2016-02-17 LAB — PROTIME-INR
INR: 1
PROTHROMBIN TIME: 13.2 s (ref 11.4–15.2)

## 2016-02-17 LAB — APTT: aPTT: 30 seconds (ref 24–36)

## 2016-02-17 LAB — I-STAT TROPONIN, ED: Troponin i, poc: 0 ng/mL (ref 0.00–0.08)

## 2016-02-17 LAB — ETHANOL: Alcohol, Ethyl (B): <5 mg/dL (ref <5)

## 2016-02-17 IMAGING — MR MR HEAD W/O CM
8 of 11 series · 37 of 48 positions shown · non-contrast
Comparison: None.

CLINICAL DATA: 75-year-old female with hand and perioral numbness
since yesterday. Symptoms progressing. Initial encounter.

EXAM:
MRI HEAD WITHOUT CONTRAST
TECHNIQUE: Multiplanar, multiecho pulse sequences of the brain and surrounding
structures were obtained without intravenous contrast.

[Series 4: DWI · axial · 3.0mm · 1.09mm/px · z∈[-67,+91]mm · 11 of 108 slices shown (1 of 4)]
[im 1/108]
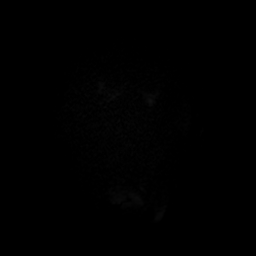
[im 11/108]
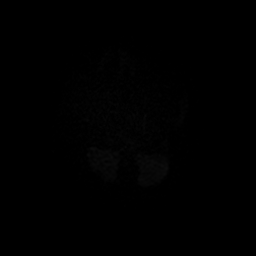
[im 22/108]
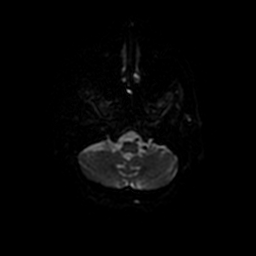
[im 33/108]
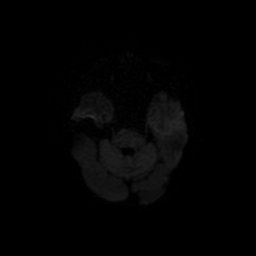
[im 43/108]
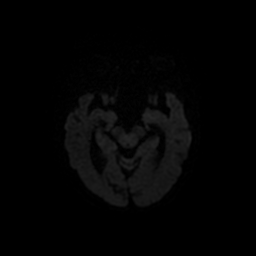
[im 54/108]
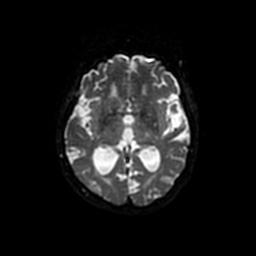
[im 65/108]
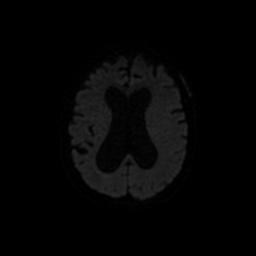
[im 75/108]
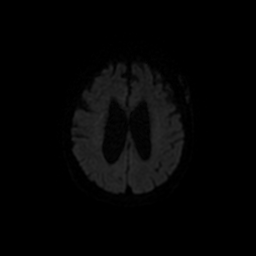
[im 86/108]
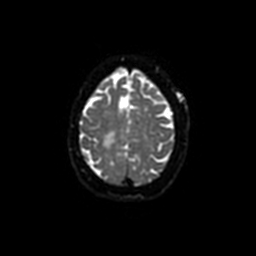
[im 97/108]
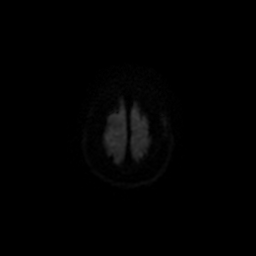
[im 108/108]
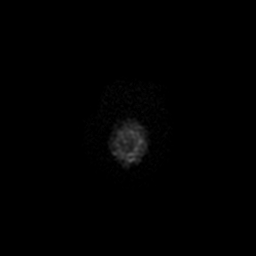

[Series 5: DWI · coronal · 5.0mm · 1.09mm/px · 7 of 72 slices shown (2 of 4)]
[im 1/72]
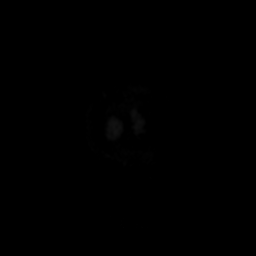
[im 12/72]
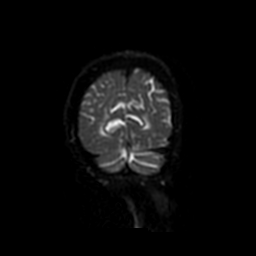
[im 24/72]
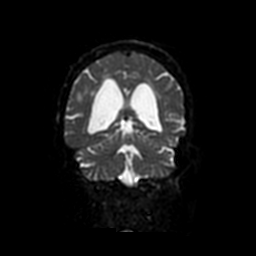
[im 36/72]
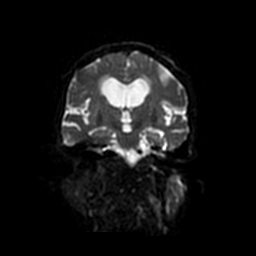
[im 48/72]
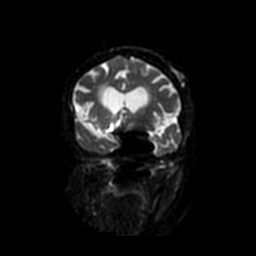
[im 60/72]
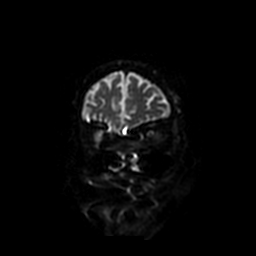
[im 72/72]
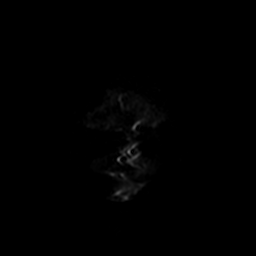

[Series 6: T2 · axial · 5.0mm · 0.43mm/px · z∈[-65,+89]mm · 2 of 25 slices shown (1 of 3)]
[im 1/25]
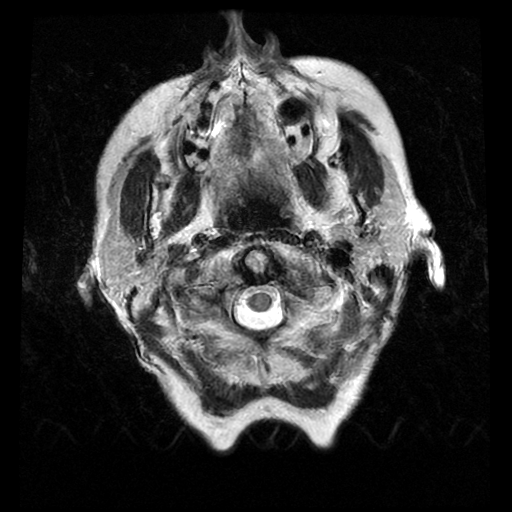
[im 25/25]
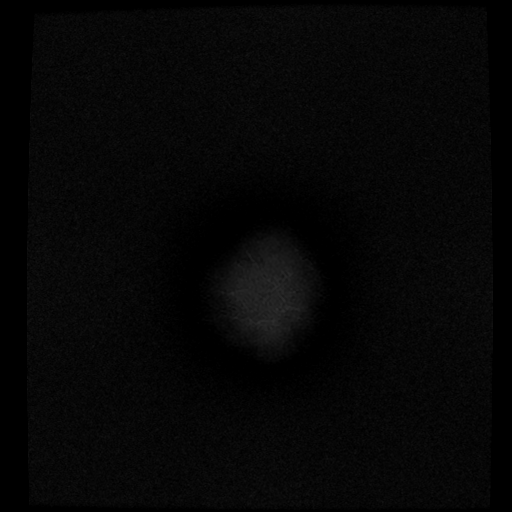

[Series 7: FLAIR · axial · 5.0mm · 0.43mm/px · z∈[-74,+98]mm · 3 of 30 slices shown]
[im 1/30]
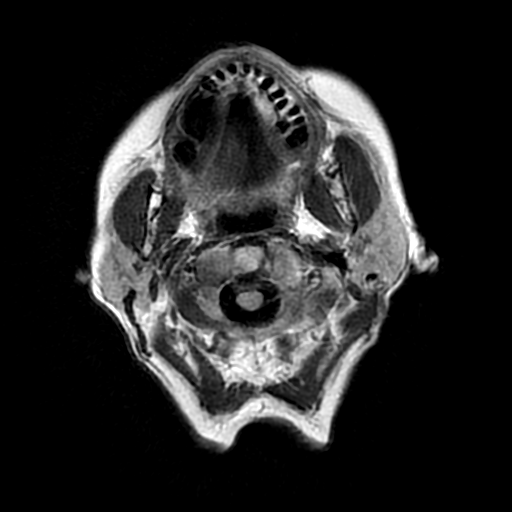
[im 15/30]
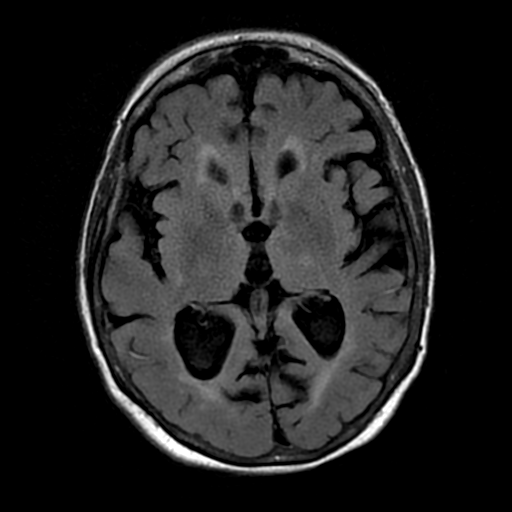
[im 30/30]
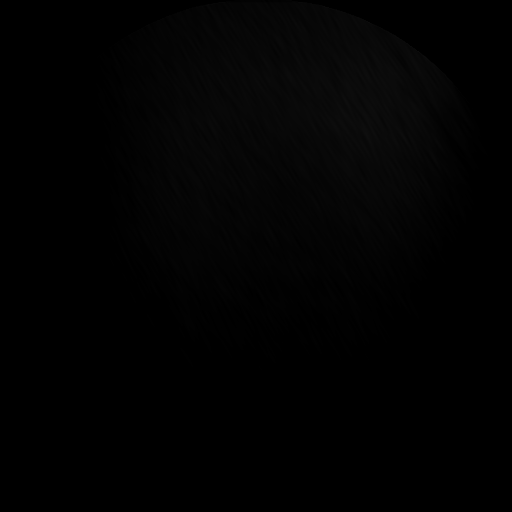

[Series 10: T2 · coronal · 5.0mm · 0.45mm/px · 3 of 27 slices shown (2 of 3)]
[im 1/27]
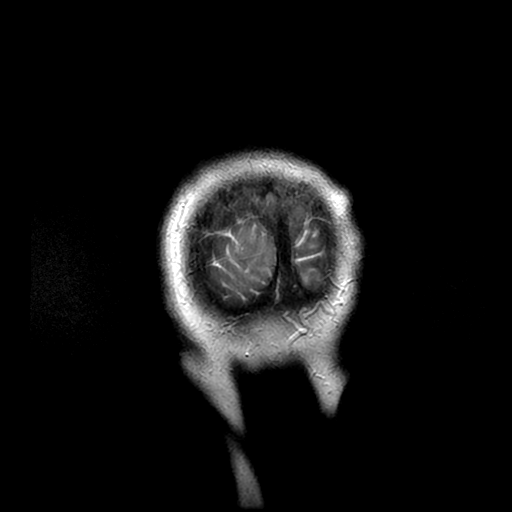
[im 14/27]
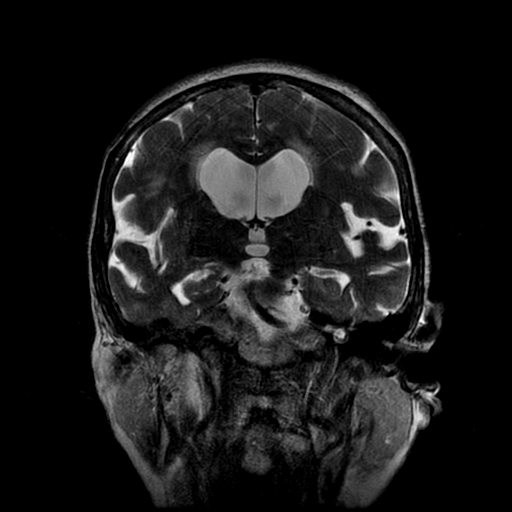
[im 27/27]
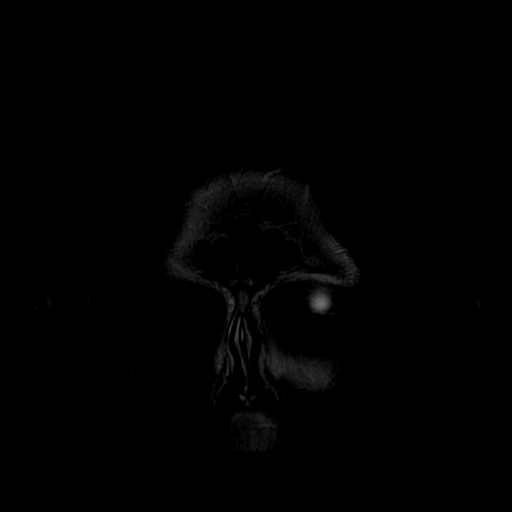

[Series 11: T2 · axial · 5.0mm · 0.43mm/px · z∈[-74,+9]mm · 2 of 30 slices shown (3 of 3)]
[im 1/30]
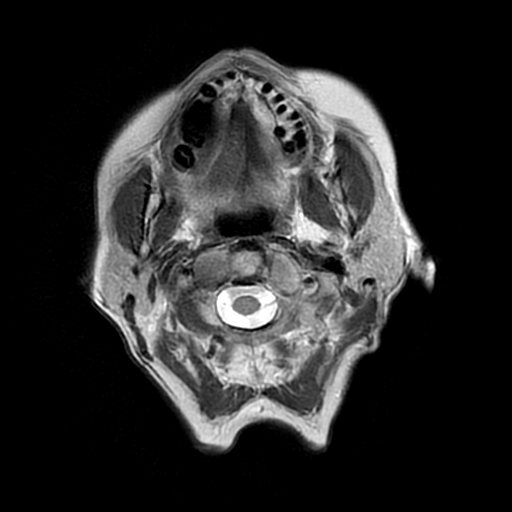
[im 15/30]
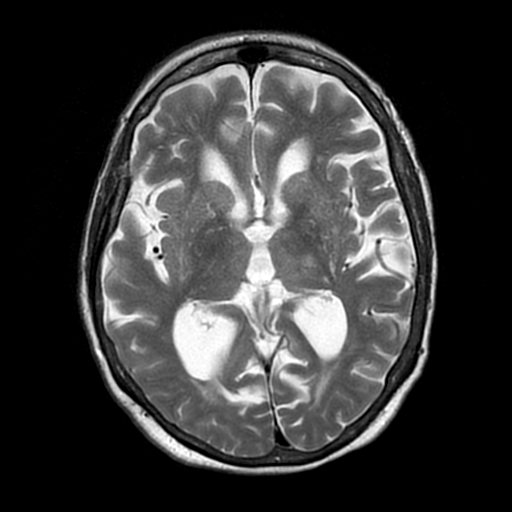

[Series 400: DWI · axial · 3.0mm · 1.09mm/px · z∈[-67,+91]mm · 5 of 54 slices shown (3 of 4)]
[im 1/54]
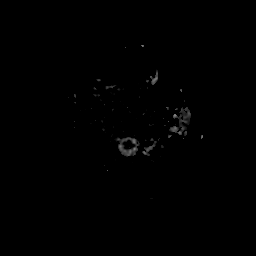
[im 14/54]
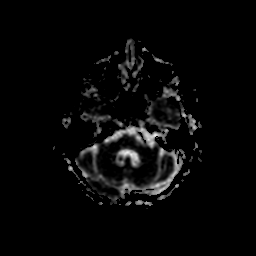
[im 27/54]
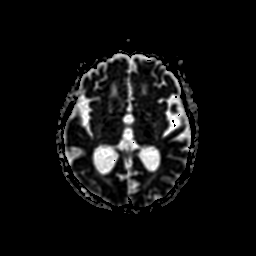
[im 40/54]
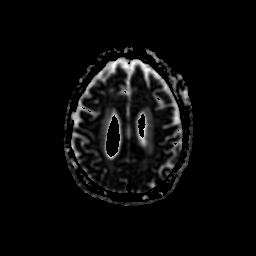
[im 54/54]
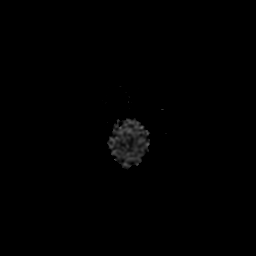

[Series 500: DWI · coronal · 5.0mm · 1.09mm/px · 4 of 36 slices shown (4 of 4)]
[im 1/36]
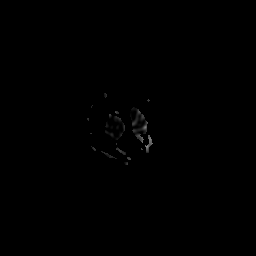
[im 12/36]
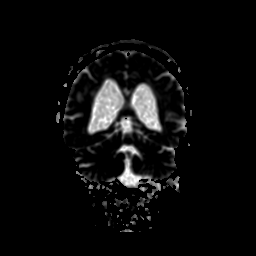
[im 24/36]
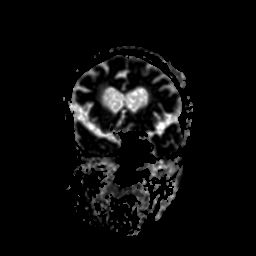
[im 36/36]
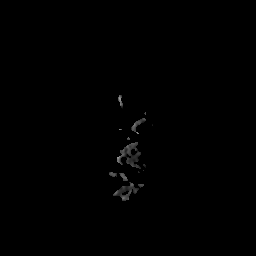

[37 of 48 positions shown; findings below may reference images not displayed]

FINDINGS: Brain: 10 mm focus of restricted diffusion in the lateral left
thalamus near the posterior limb of the left internal capsule
(series 4, image 27). Faint associated T2 and FLAIR hyperintensity.
No associated hemorrhage or mass effect.

Elsewhere no other restricted diffusion. No midline shift, mass
effect, evidence of mass lesion, extra-axial collection or acute
intracranial hemorrhage. Cervicomedullary junction and pituitary are
within normal limits.

Ventricular prominence which probably is ex vacuo related. Patchy
bilateral cerebral white matter T2 and FLAIR hyperintensity. No
cortical encephalomalacia. No chronic cerebral blood products
identified. Outside of the acute findings mild deep gray matter
nuclei T2 heterogeneity. Similar mild T2 heterogeneity in the pons.
Cerebellum within normal limits.

Vascular: Major intracranial vascular flow voids are preserved with
a degree of generalized intracranial artery dolichoectasia.

Skull and upper cervical spine: Negative visualized cervical spine.
Visualized bone marrow signal is within normal limits.

Sinuses/Orbits: Paranasal sinuses and mastoids are clear.
Postoperative changes to both globes.

Other: Scalp soft tissuesThere is a 3 cm area of left anterior
convexity scalp soft tissue swelling which is nonspecific but may
have a number of associated small vessels (series 9, image 37 and
series 6, image 16). Other scalp soft tissues appear normal.
IMPRESSION: 1. Acute lacunar infarct lateral left thalamus near the posterior
limb of the left internal capsule. No associated hemorrhage or mass
effect.
2. Patchy chronic appearing signal changes in the cerebral white
matter and to a lesser extent deep gray matter nuclei and pons,
favor chronic small vessel disease related.
3. Left lateral scalp soft tissue 3 cm swelling with suggestion of
multiple small vessels such as due to a scalp vascular malformation.

## 2016-02-17 MED ORDER — STROKE: EARLY STAGES OF RECOVERY BOOK
Freq: Once | Status: AC
  Administered 2016-02-17: 16:00:00
  Filled 2016-02-17: qty 1

## 2016-02-17 MED ORDER — SENNOSIDES-DOCUSATE SODIUM 8.6-50 MG PO TABS: 1.0000 | ORAL_TABLET | Freq: Every evening | ORAL | Status: DC | PRN

## 2016-02-17 MED ORDER — ASPIRIN 81 MG PO CHEW
324.0000 mg | CHEWABLE_TABLET | Freq: Once | ORAL | Status: AC
  Administered 2016-02-17: 324 mg via ORAL
  Filled 2016-02-17: qty 4

## 2016-02-17 MED ORDER — ASPIRIN EC 81 MG PO TBEC
81.0000 mg | DELAYED_RELEASE_TABLET | Freq: Every day | ORAL | Status: DC
  Administered 2016-02-18: 81 mg via ORAL
  Filled 2016-02-17: qty 1

## 2016-02-17 MED ORDER — NITROGLYCERIN 0.4 MG SL SUBL
0.4000 mg | SUBLINGUAL_TABLET | SUBLINGUAL | Status: DC | PRN
  Administered 2016-02-17: 0.4 mg via SUBLINGUAL
  Filled 2016-02-17 (×2): qty 1

## 2016-02-17 MED ORDER — LORAZEPAM 2 MG/ML IJ SOLN
1.0000 mg | INTRAMUSCULAR | Status: AC | PRN
  Administered 2016-02-17: 1 mg via INTRAVENOUS
  Filled 2016-02-17: qty 1

## 2016-02-17 MED ORDER — ENOXAPARIN SODIUM 40 MG/0.4ML ~~LOC~~ SOLN
40.0000 mg | SUBCUTANEOUS | Status: DC
  Administered 2016-02-17 – 2016-02-18 (×2): 40 mg via SUBCUTANEOUS
  Filled 2016-02-17 (×2): qty 0.4

## 2016-02-17 NOTE — H&P (Signed)
History and Physical    Vanessa Ford [id number] DOB: 11-28-1940 DOA: 02/17/2016  PCP: Precious Reel, MD  Outpatient Specialists: GI, Dr. Olevia Perches Patient coming from: home  Chief Complaint: numbness  HPI: Vanessa Ford is a 75 y.o. female generally healthy with history of tubular adenoma of the colon and hyperplastic polyp, diverticulosis, osteoporosis,presents to the emergency room with chief complaint of numbness around her mouth, as well as tingling in bilateral hands, as well as difficulties with balance and feeling dizzy. Patient has noticed this symptom since yesterday morning, and on my evaluation there almost completely resolved. She did not seek care until today. She denies any fever or chills. She denies any chest pain or palpitations. She has no abdominal pain, no nausea, vomiting or diarrhea. She is otherwise feeling at baseline up until the symptoms started.  ED Course: in the emergency room, she was initially hypertensive to 200/110, however her blood pressure normalized, vital signs are otherwise stable.her blood work is fairly unremarkable.she underwent an MRI of the brain which showed an acute lacunar infarct lateral left thalamus near the posterior limb of the left internal capsule. Neurology was consulted and TRH was asked for admission from [facility] emergency department to [place]: As per HPI otherwise 10 point review of systems negative.   Past Medical History:  Diagnosis Date  . Diverticulosis   . Hyperplastic colon polyp 2004  . Osteoporosis   . Tubular adenoma of colon 2004    Past Surgical History:  Procedure Laterality Date  . FACIAL COSMETIC SURGERY    . NASAL SEPTUM SURGERY    . SQUAMOUS CELL CARCINOMA EXCISION     face, hand     reports that she quit smoking about 36 years ago. Her smoking use included Cigarettes. She has never used smokeless tobacco. She reports that she drinks alcohol. She reports that she does not use  drugs.  Allergies  Allergen Reactions  . Doxycycline Other (See Comments)    esophagitis  . Other Itching and Rash    Band aids    Family History  Problem Relation Age of Onset  . Colon cancer Mother   . Cirrhosis Father   . Stroke Paternal Grandmother   . Heart attack Maternal Grandmother     Prior to Admission medications   Medication Sig Start Date End Date Taking? Authorizing Provider  CRANBERRY PO Take 1 tablet by mouth daily.   Yes Historical Provider, MD  Cyanocobalamin (B-12 PO) Take 1 tablet by mouth daily.   Yes Historical Provider, MD  ECHINACEA PO Take 1 tablet by mouth daily.   Yes Historical Provider, MD  estradiol (ESTRACE) 2 MG tablet Take 1 mg by mouth daily.   Yes Historical Provider, MD  ibuprofen (ADVIL,MOTRIN) 200 MG tablet Take 400 mg by mouth every 6 (six) hours as needed for headache.   Yes Historical Provider, MD  medroxyPROGESTERone (PROVERA) 5 MG tablet Take 5 mg by mouth at bedtime.  01/16/16  Yes Historical Provider, MD  Multiple Vitamin (MULTIVITAMIN WITH MINERALS) TABS tablet Take 1 tablet by mouth daily.   Yes Historical Provider, MD    Physical Exam: Vitals:   02/17/16 1200 02/17/16 1230 02/17/16 1443 02/17/16 1500  BP: 148/88 149/86 155/88 140/81  Pulse: 81 77 89 89  Resp: 17 18 20 15   Temp:      TempSrc:      SpO2: 99% 100% 99% 96%  Weight:      Height:  Constitutional: NAD Vitals:   02/17/16 1200 02/17/16 1230 02/17/16 1443 02/17/16 1500  BP: 148/88 149/86 155/88 140/81  Pulse: 81 77 89 89  Resp: 17 18 20 15   Temp:      TempSrc:      SpO2: 99% 100% 99% 96%  Weight:      Height:       Eyes: PERRL ENMT: Mucous membranes are moist. Posterior pharynx clear of any exudate or lesions.  Neck: normal, supple, no masses, no thyromegaly Respiratory: clear to auscultation bilaterally, no wheezing, no crackles. Normal respiratory effort. No accessory muscle use.  Cardiovascular: Regular rate and rhythm, no murmurs / rubs /  gallops. No extremity edema. 2+ pedal pulses.  Abdomen: no tenderness, no masses palpated. Bowel sounds positive.  Musculoskeletal: no clubbing / cyanosis. Normal muscle tone.  Skin: no rashes, lesions, ulcers. No induration Neurologic: CN 2-12 grossly intact. Strength 5/5 in all 4. No sensation deficits. No pronator drift  Psychiatric: Normal judgment and insight. Alert and oriented x 3. Normal mood.   Labs on Admission: I have personally reviewed following labs and imaging studies  CBC:  Recent Labs Lab 02/17/16 1045 02/17/16 1059  WBC 8.5  --   NEUTROABS 6.5  --   HGB 16.1* 16.3*  HCT 47.4* 48.0*  MCV 89.3  --   PLT 205  --    Basic Metabolic Panel:  Recent Labs Lab 02/17/16 1045 02/17/16 1059  NA 142 142  K 3.5 3.4*  CL 108 104  CO2 24  --   GLUCOSE 116* 118*  BUN 12 11  CREATININE 0.57 0.60  CALCIUM 9.7  --    Liver Function Tests:  Recent Labs Lab 02/17/16 1045  AST 24  ALT 20  ALKPHOS 62  BILITOT 1.1  PROT 7.3  ALBUMIN 4.4   Coagulation Profile:  Recent Labs Lab 02/17/16 1045  INR 1.00   Urine analysis:    Component Value Date/Time   COLORURINE YELLOW 02/17/2016 [place] 02/17/2016 1041   LABSPEC 1.009 02/17/2016 1041   PHURINE 6.5 02/17/2016 [place] 02/17/2016 [place] 02/17/2016 [place] 02/17/2016 [place] 02/17/2016 [place] 02/17/2016 1041   NITRITE NEGATIVE 02/17/2016 1041   LEUKOCYTESUR MODERATE (A) 02/17/2016 1041   Sepsis Labs: @LABRCNTIP (procalcitonin:4,lacticidven:4) )No results found for this or any previous visit (from the past 240 hour(s)).   Radiological Exams on Admission: Dg Chest 2 View  Result Date: 02/17/2016 CLINICAL DATA:  Left chest pain EXAM: CHEST  2 VIEW COMPARISON:  None FINDINGS: Lungs are clear.  No pleural effusion of pneumothorax. The heart is normal in size. Old right rib fracture deformities.  IMPRESSION: No evidence of acute cardiopulmonary disease. Electronically Signed   By: Julian Hy M.D.   On: 02/17/2016 11:13   Mr Brain Wo Contrast  Result Date: 02/17/2016 CLINICAL DATA:  75 year old female with hand and perioral numbness since yesterday. Symptoms progressing. Initial encounter. EXAM: MRI HEAD WITHOUT CONTRAST TECHNIQUE: Multiplanar, multiecho pulse sequences of the brain and surrounding structures were obtained without intravenous contrast. COMPARISON:  None. FINDINGS: Brain: 10 mm focus of restricted diffusion in the lateral left thalamus near the posterior limb of the left internal capsule (series 4, image 27). Faint associated T2 and FLAIR hyperintensity. No associated hemorrhage or mass effect. Elsewhere no other restricted diffusion. No midline shift, mass effect, evidence of mass lesion, extra-axial collection or acute intracranial hemorrhage. Cervicomedullary  junction and pituitary are within normal limits. Ventricular prominence which probably is ex vacuo related. Patchy bilateral cerebral white matter T2 and FLAIR hyperintensity. No cortical encephalomalacia. No chronic cerebral blood products identified. Outside of the acute findings mild deep gray matter nuclei T2 heterogeneity. Similar mild T2 heterogeneity in the pons. Cerebellum within normal limits. Vascular: Major intracranial vascular flow voids are preserved with a degree of generalized intracranial artery dolichoectasia. Skull and upper cervical spine: Negative visualized cervical spine. Visualized bone marrow signal is within normal limits. Sinuses/Orbits: Paranasal sinuses and mastoids are clear. Postoperative changes to both globes. Other: Scalp soft tissuesThere is a 3 cm area of left anterior convexity scalp soft tissue swelling which is nonspecific but may have a number of associated small vessels (series 9, image 37 and series 6, image 16). Other scalp soft tissues appear normal. IMPRESSION: 1. Acute lacunar  infarct lateral left thalamus near the posterior limb of the left internal capsule. No associated hemorrhage or mass effect. 2. Patchy chronic appearing signal changes in the cerebral white matter and to a lesser extent deep gray matter nuclei and pons, favor chronic small vessel disease related. 3. 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 Electronically Signed   By: Genevie Ann M.D.   On: 02/17/2016 13:53   EKG: Independently reviewed. On specific ST segment changes  Assessment/Plan Active Problems:   CVA (cerebral infarction)   Acute CVA - admit patient to [facility], neurology will see patient on arrival,  - Telemetry, neuro checks, SLP evaluation per protocol - Aspirin given in the ED, resume aspirin 81 mg starting tomorrow - obtain 2-D echo, carotids, further workup deferred to neurology  EKG changes - Patient is nonspecific ST segment changes throughout her EKG, she was told in the past and her EKG is abnormal, she is completely asymptomatic, will obtain 2-D echo as above   DVT prophylaxis:  Lovenox Code Status: full code  Family Communication: family and the room Disposition Plan: admit to telemetry Consults called: neurology  Admission status: inpatient    Marzetta Board, MD [facility] Pager [phone]  If 7PM-7AM, please contact night-coverage [email or web address] Password [facility]  02/17/2016, 3:15 PM

## 2016-02-17 NOTE — Progress Notes (Signed)
*  PRELIMINARY RESULTS* Vascular Ultrasound Carotid Duplex (Doppler) has been completed.  Preliminary findings: Bilateral: No significant (1-39%) ICA stenosis. Antegrade vertebral flow.     Landry Mellow, RDMS, RVT  02/17/2016, 3:38 PM

## 2016-02-17 NOTE — Progress Notes (Signed)
Pt arrived around 2200 alert and oriented times 4, slight gait disturbance from equilibrium being off, other wise slight tingling around mouth. No pain will continue to monitor.

## 2016-02-17 NOTE — ED Notes (Signed)
[facility] called for transport. 

## 2016-02-17 NOTE — ED Triage Notes (Signed)
Per patient:  States starting yesterday, she has been feeling tingling in her hands and numbness around mouth.  Per daughter she had mentioned it to her yesterday morning, proceeded to drive to [place] and back, and the symptoms continued.  States the symptoms are worse today.  Also states she feels a bit unsteady on her feet.  Her BP is normally around 130/70's.  Currently BP is 202/107.  States "it hurts a little bit under left breast and feels a bit nauseated"

## 2016-02-17 NOTE — ED Notes (Signed)
Called [facility] at [facility] to give report. Secretary reports that the nurse taking that room is not there yet, and gave no time to call back.

## 2016-02-17 NOTE — ED Notes (Signed)
Called for second time for report. Paramedic unavailable. Told to call back.

## 2016-02-17 NOTE — ED Provider Notes (Signed)
Vanessa Ford DEPT Provider Note   CSN: [id number] Arrival date & time: 02/17/16  [id number]     History   Chief Complaint Chief Complaint  Patient presents with  . Numbness    HPI Vanessa Ford is a 75 y.o. female presenting with numbness. Patient states that she went to [facility] yesterday and sometime in afternoon noticed that she was having perioral numbness diffusely. Has also noticed bilateral hand tingling but not really numbness. No headaches, blurry vision, or focal weakness. However since yesterday she's been feeling off balance and unsteady. She is not feeling dizzy, lightheaded or a room spinning sensation. She states she has a history of whitecoat hypertension and typically at home her blood pressure is consistently in the [id number] systolic. However it becomes severely elevated on any type of doctor visit. She is not on antihypertensives. She has not been having chest pain, except for when she got back into the room today she noticed some mild pain under her left breast. No shortness of breath.  HPI  Past Medical History:  Diagnosis Date  . Diverticulosis   . Hyperplastic colon polyp 2004  . Osteoporosis   . Tubular adenoma of colon 2004    Patient Active Problem List   Diagnosis Date Noted  . Odynophagia 10/13/2012    Past Surgical History:  Procedure Laterality Date  . FACIAL COSMETIC SURGERY    . NASAL SEPTUM SURGERY    . SQUAMOUS CELL CARCINOMA EXCISION     face, hand    OB History    No data available       Home Medications    Prior to Admission medications   Medication Sig Start Date End Date Taking? Authorizing Provider  CRANBERRY PO Take 1 tablet by mouth daily.   Yes Historical Provider, MD  Cyanocobalamin (B-12 PO) Take 1 tablet by mouth daily.   Yes Historical Provider, MD  ECHINACEA PO Take 1 tablet by mouth daily.   Yes Historical Provider, MD  estradiol (ESTRACE) 2 MG tablet Take 1 mg by mouth daily.   Yes Historical Provider, MD  ibuprofen  (ADVIL,MOTRIN) 200 MG tablet Take 400 mg by mouth every 6 (six) hours as needed for headache.   Yes Historical Provider, MD  medroxyPROGESTERone (PROVERA) 5 MG tablet Take 5 mg by mouth at bedtime.  01/16/16  Yes Historical Provider, MD  Multiple Vitamin (MULTIVITAMIN WITH MINERALS) TABS tablet Take 1 tablet by mouth daily.   Yes Historical Provider, MD    Family History Family History  Problem Relation Age of Onset  . Colon cancer Mother   . Cirrhosis Father   . Stroke Paternal Grandmother   . Heart attack Maternal Grandmother     Social History Social History  Substance Use Topics  . Smoking status: Former Smoker    Types: Cigarettes    Quit date: 06/08/1979  . Smokeless tobacco: Never Used  . Alcohol use Yes     Comment: 2 drinks on the weekend     Allergies   Doxycycline and Other   Review of Systems Review of Systems  Eyes: Negative for visual disturbance.  Respiratory: Negative for shortness of breath.   Cardiovascular: Positive for chest pain.  Musculoskeletal: Positive for gait problem.  Neurological: Positive for numbness. Negative for dizziness, weakness and headaches.  All other systems reviewed and are negative.    Physical Exam Updated Vital Signs BP 155/88 (BP Location: Right Arm)   Pulse 89   Temp 98.3 F (36.8 C) (Oral)  Resp 20   Ht 5\' 9"  (1.753 m)   Wt 145 lb (65.8 kg)   SpO2 99%   BMI 21.41 kg/m   Physical Exam  Constitutional: She is oriented to person, place, and time. She appears well-developed and well-nourished.  HENT:  Head: Normocephalic and atraumatic.  Right Ear: External ear normal.  Left Ear: External ear normal.  Nose: Nose normal.  Eyes: EOM are normal. Pupils are equal, round, and reactive to light. Right eye exhibits no discharge. Left eye exhibits no discharge.  Cardiovascular: Normal rate, regular rhythm and normal heart sounds.   Pulmonary/Chest: Effort normal and breath sounds normal. She exhibits no tenderness.    Abdominal: Soft. There is no tenderness.  Neurological: She is alert and oriented to person, place, and time.  CN 3-12 grossly intact. 5/5 strength in all 4 extremities. Grossly normal sensation (specifically hands and around lips). Normal finger to nose. Normal gait  Skin: Skin is warm and dry.  Nursing note and vitals reviewed.    ED Treatments / Results  Labs (all labs ordered are listed, but only abnormal results are displayed) Labs Reviewed  CBC - Abnormal; Notable for the following:       Result Value   RBC 5.31 (*)    Hemoglobin 16.1 (*)    HCT 47.4 (*)    All other components within normal limits  COMPREHENSIVE METABOLIC PANEL - Abnormal; Notable for the following:    Glucose, Bld 116 (*)    All other components within normal limits  URINALYSIS, ROUTINE W REFLEX MICROSCOPIC (NOT AT [facility]) - Abnormal; Notable for the following:    Leukocytes, UA MODERATE (*)    All other components within normal limits  URINE MICROSCOPIC-ADD ON - Abnormal; Notable for the following:    Squamous Epithelial / LPF 0-5 (*)    Bacteria, UA FEW (*)    All other components within normal limits  I-STAT CHEM 8, ED - Abnormal; Notable for the following:    Potassium 3.4 (*)    Glucose, Bld 118 (*)    Hemoglobin 16.3 (*)    HCT 48.0 (*)    All other components within normal limits  PROTIME-INR  APTT  DIFFERENTIAL  URINE RAPID DRUG SCREEN, HOSP PERFORMED  ETHANOL  I-STAT TROPOININ, ED    EKG  EKG Interpretation  Date/Time:  Tuesday February 17 2016 12:39:09 EDT Ventricular Rate:  88 PR Interval:    QRS Duration: 105 QT Interval:  348 QTC Calculation: 421 R Axis:   77 Text Interpretation:  Sinus rhythm RSR' in V1 or V2, right VCD or RVH Repol abnrm suggests ischemia, diffuse leads no significant change since earlier in the day Confirmed by Rickeya Manus MD, [place] Kanitz [phone]) on 02/17/2016 2:22:28 PM       Radiology Dg Chest 2 View  Result Date: 02/17/2016 CLINICAL DATA:  Left chest pain  EXAM: CHEST  2 VIEW COMPARISON:  None FINDINGS: Lungs are clear.  No pleural effusion of pneumothorax. The heart is normal in size. Old right rib fracture deformities. IMPRESSION: No evidence of acute cardiopulmonary disease. Electronically Signed   By: Julian Hy M.D.   On: 02/17/2016 11:13   Mr Brain Wo Contrast  Result Date: 02/17/2016 CLINICAL DATA:  75 year old female with hand and perioral numbness since yesterday. Symptoms progressing. Initial encounter. EXAM: MRI HEAD WITHOUT CONTRAST TECHNIQUE: Multiplanar, multiecho pulse sequences of the brain and surrounding structures were obtained without intravenous contrast. COMPARISON:  None. FINDINGS: Brain: 10 mm focus of restricted diffusion  in the lateral left thalamus near the posterior limb of the left internal capsule (series 4, image 27). Faint associated T2 and FLAIR hyperintensity. No associated hemorrhage or mass effect. Elsewhere no other restricted diffusion. No midline shift, mass effect, evidence of mass lesion, extra-axial collection or acute intracranial hemorrhage. Cervicomedullary junction and pituitary are within normal limits. Ventricular prominence which probably is ex vacuo related. Patchy bilateral cerebral white matter T2 and FLAIR hyperintensity. No cortical encephalomalacia. No chronic cerebral blood products identified. Outside of the acute findings mild deep gray matter nuclei T2 heterogeneity. Similar mild T2 heterogeneity in the pons. Cerebellum within normal limits. Vascular: Major intracranial vascular flow voids are preserved with a degree of generalized intracranial artery dolichoectasia. Skull and upper cervical spine: Negative visualized cervical spine. Visualized bone marrow signal is within normal limits. Sinuses/Orbits: Paranasal sinuses and mastoids are clear. Postoperative changes to both globes. Other: Scalp soft tissuesThere is a 3 cm area of left anterior convexity scalp soft tissue swelling which is  nonspecific but may have a number of associated small vessels (series 9, image 37 and series 6, image 16). Other scalp soft tissues appear normal. IMPRESSION: 1. Acute lacunar infarct lateral left thalamus near the posterior limb of the left internal capsule. No associated hemorrhage or mass effect. 2. Patchy chronic appearing signal changes in the cerebral white matter and to a lesser extent deep gray matter nuclei and pons, favor chronic small vessel disease related. 3. 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 Electronically Signed   By: Genevie Ann M.D.   On: 02/17/2016 13:53    Procedures Procedures (including critical care time)  Medications Ordered in ED Medications  nitroGLYCERIN (NITROSTAT) SL tablet 0.4 mg (0.4 mg Sublingual Not Given 02/17/16 1140)  aspirin chewable tablet 324 mg (324 mg Oral Given 02/17/16 1142)  LORazepam (ATIVAN) injection 1 mg (1 mg Intravenous Given 02/17/16 1251)     Initial Impression / Assessment and Plan / ED Course  I have reviewed the triage vital signs and the nursing notes.  Pertinent labs & imaging results that were available during my care of the patient were reviewed by me and considered in my medical decision making (see chart for details).  Clinical Course  Comment By Time  Check labs, ECG, CXR, MRI. No focal neuro deficits. My suspicion for hemorrhage intracranially is quite low with no headache or focal deficits. No CT Sherwood Gambler, MD 09/12 1039    D/w Dr. Leonel Ramsay, will see in consult on admission to [facility]. Dr. Renne Crigler to see and put in admit orders. CP is gone. Review shows ECG has significant T wave abnormalities for a few years from [facility]. Probably not ACS. Will need CVA workup. Stable for transfer/admission.  Final Clinical Impressions(s) / ED Diagnoses   Final diagnoses:  Thalamic infarct, acute [facility])    New Prescriptions New Prescriptions     No medications on file     Sherwood Gambler, MD 02/17/16 1457

## 2016-02-17 NOTE — ED Notes (Signed)
Called x 1 for report. Told to call back in 10 minutes.

## 2016-02-17 NOTE — ED Notes (Signed)
Pharmacy called about missing Lovenox.

## 2016-02-17 NOTE — ED Notes (Signed)
Pt ambulatory to the restroom.  

## 2016-02-17 NOTE — ED Notes (Signed)
Pt taken to MRI  

## 2016-02-17 NOTE — ED Notes (Signed)
Report given to [facility] RN  

## 2016-02-18 ENCOUNTER — Encounter (HOSPITAL_COMMUNITY): Payer: Self-pay | Admitting: Radiology

## 2016-02-18 ENCOUNTER — Inpatient Hospital Stay (HOSPITAL_COMMUNITY): Payer: Medicare Other

## 2016-02-18 DIAGNOSIS — I10 Essential (primary) hypertension: Secondary | ICD-10-CM

## 2016-02-18 DIAGNOSIS — I6789 Other cerebrovascular disease: Secondary | ICD-10-CM

## 2016-02-18 DIAGNOSIS — E876 Hypokalemia: Secondary | ICD-10-CM

## 2016-02-18 DIAGNOSIS — I639 Cerebral infarction, unspecified: Principal | ICD-10-CM

## 2016-02-18 LAB — ECHOCARDIOGRAM COMPLETE
HEIGHTINCHES: 69 in
WEIGHTICAEL: 2352 [oz_av]

## 2016-02-18 MED ORDER — IOPAMIDOL (ISOVUE-370) INJECTION 76%
INTRAVENOUS | Status: AC
Start: 2016-02-18 — End: 2016-02-18
  Administered 2016-02-18: 50 mL
  Filled 2016-02-18: qty 50

## 2016-02-18 MED ORDER — ADULT MULTIVITAMIN W/MINERALS CH
1.0000 | ORAL_TABLET | Freq: Every day | ORAL | Status: DC
  Administered 2016-02-18 – 2016-02-19 (×2): 1 via ORAL
  Filled 2016-02-18 (×2): qty 1

## 2016-02-18 MED ORDER — HYDRALAZINE HCL 20 MG/ML IJ SOLN: 5.0000 mg | INTRAMUSCULAR | Status: DC | PRN

## 2016-02-18 MED ORDER — ASPIRIN EC 325 MG PO TBEC
325.0000 mg | DELAYED_RELEASE_TABLET | Freq: Every day | ORAL | Status: DC
  Administered 2016-02-19: 325 mg via ORAL
  Filled 2016-02-18: qty 1

## 2016-02-18 NOTE — Consult Note (Signed)
Requesting Physician: Dr. Maylene Roes    Chief Complaint: stroke  History obtained from:  Patient     HPI:                                                                                                                                         Vanessa Ford is an 75 y.o. female is in the hospital secondary to noticing new onset right facial tingling and some difficulty is with her balance and feeling dizzy. Currently the symptoms have all but subsided other than bilateral tingling in her fingers. Patient states that she was taking aspirin 81 mg daily in the past but not currently. She states that her blood pressure is usually systolically in the [id number] and diastolically in the [id number]. She is very active and walks daily along with water aerobics. She states she is not a diabetic but is unsure about her cholesterol. MRI was obtained showing a small thalamic and internal capsule stroke.  Date last known well: Date: 02/16/2016 Time last known well: Unable to determine tPA Given: No: out of window   Past Medical History:  Diagnosis Date  . Diverticulosis   . Fibroid   . Hyperplastic colon polyp 2004  . Osteoporosis   . PONV (postoperative nausea and vomiting)   . Tubular adenoma of colon 2004    Past Surgical History:  Procedure Laterality Date  . FACIAL COSMETIC SURGERY    . NASAL SEPTUM SURGERY    . SQUAMOUS CELL CARCINOMA EXCISION     face, hand  . UTERINE FIBROID SURGERY      Family History  Problem Relation Age of Onset  . Colon cancer Mother   . Cirrhosis Father   . Stroke Paternal Grandmother   . Heart attack Maternal Grandmother    Social History:  reports that she quit smoking about 36 years ago. Her smoking use included Cigarettes. She has never used smokeless tobacco. She reports that she drinks alcohol. She reports that she does not use drugs.  Allergies:  Allergies  Allergen Reactions  . Doxycycline Other (See Comments)    esophagitis  . Other Itching and Rash    Band aids     Medications:  Prior to Admission:  Prescriptions Prior to Admission  Medication Sig Dispense Refill Last Dose  . CRANBERRY PO Take 1 tablet by mouth daily.   02/16/2016 at Unknown time  . Cyanocobalamin (B-12 PO) Take 1 tablet by mouth daily.   02/16/2016 at Unknown time  . ECHINACEA PO Take 1 tablet by mouth daily.   02/16/2016 at Unknown time  . estradiol (ESTRACE) 2 MG tablet Take 1 mg by mouth daily.   02/16/2016 at Unknown time  . ibuprofen (ADVIL,MOTRIN) 200 MG tablet Take 400 mg by mouth every 6 (six) hours as needed for headache.   2 weeks  . medroxyPROGESTERone (PROVERA) 5 MG tablet Take 5 mg by mouth at bedtime.   10 02/16/2016 at Unknown time  . Multiple Vitamin (MULTIVITAMIN WITH MINERALS) TABS tablet Take 1 tablet by mouth daily.   02/16/2016 at Unknown time   Scheduled: . aspirin EC  81 mg Oral Daily  . enoxaparin (LOVENOX) injection  40 mg Subcutaneous Q24H  . multivitamin with minerals  1 tablet Oral Daily    ROS:                                                                                                                                       History obtained from the patient  General ROS: negative for - chills, fatigue, fever, night sweats, weight gain or weight loss Psychological ROS: negative for - behavioral disorder, hallucinations, memory difficulties, mood swings or suicidal ideation Ophthalmic ROS: negative for - blurry vision, double vision, eye pain or loss of vision ENT ROS: negative for - epistaxis, nasal discharge, oral lesions, sore throat, tinnitus or vertigo Allergy and Immunology ROS: negative for - hives or itchy/watery eyes Hematological and Lymphatic ROS: negative for - bleeding problems, bruising or swollen lymph nodes Endocrine ROS: negative for - galactorrhea, hair pattern changes, polydipsia/polyuria or temperature  intolerance Respiratory ROS: negative for - cough, hemoptysis, shortness of breath or wheezing Cardiovascular ROS: negative for - chest pain, dyspnea on exertion, edema or irregular heartbeat Gastrointestinal ROS: negative for - abdominal pain, diarrhea, hematemesis, nausea/vomiting or stool incontinence Genito-Urinary ROS: negative for - dysuria, hematuria, incontinence or urinary frequency/urgency Musculoskeletal ROS: negative for - joint swelling or muscular weakness Neurological ROS: as noted in HPI Dermatological ROS: negative for rash and skin lesion changes  Neurologic Examination:  Blood pressure (!) 170/92, pulse 95, temperature 98.6 F (37 C), temperature source Oral, resp. rate 16, height 5\' 9"  (1.753 m), weight 66.7 kg (147 lb), SpO2 96 %.  HEENT-  Normocephalic, no lesions, without obvious abnormality.  Normal external eye and conjunctiva.  Normal TM's bilaterally.  Normal auditory canals and external ears. Normal external nose, mucus membranes and septum.  Normal pharynx. Cardiovascular- S1, S2 normal, pulses palpable throughout   Lungs- chest clear, no wheezing, rales, normal symmetric air entry Abdomen- normal findings: bowel sounds normal Extremities- no edema Lymph-no adenopathy palpable Musculoskeletal-no joint tenderness, deformity or swelling Skin-warm and dry, no hyperpigmentation, vitiligo, or suspicious lesions  Neurological Examination Mental Status: Alert, oriented, thought content appropriate.  Speech fluent without evidence of aphasia.  Able to follow 3 step commands without difficulty. Cranial Nerves: II: Discs flat bilaterally; Visual fields grossly normal, pupils equal, round, reactive to light and accommodation III,IV, VI: ptosis not present, extra-ocular motions intact bilaterally V,VII: smile symmetric, facial light touch sensation normal bilaterally VIII:  hearing normal bilaterally IX,X: uvula rises symmetrically XI: bilateral shoulder shrug XII: midline tongue extension Motor: Right : Upper extremity   5/5    Left:     Upper extremity   5/5  Lower extremity   5/5     Lower extremity   5/5 Tone and bulk:normal tone throughout; no atrophy noted Sensory: Pinprick and light touch intact throughout, bilaterally Deep Tendon Reflexes: 2+ and symmetric throughout Plantars: Right: downgoing   Left: downgoing Cerebellar: normal finger-to-nose,  normal heel-to-shin test Gait: Not tested       Lab Results: Basic Metabolic Panel:  Recent Labs Lab 02/17/16 1045 02/17/16 1059  NA 142 142  K 3.5 3.4*  CL 108 104  CO2 24  --   GLUCOSE 116* 118*  BUN 12 11  CREATININE 0.57 0.60  CALCIUM 9.7  --     Liver Function Tests:  Recent Labs Lab 02/17/16 1045  AST 24  ALT 20  ALKPHOS 62  BILITOT 1.1  PROT 7.3  ALBUMIN 4.4   No results for input(s): LIPASE, AMYLASE in the last 168 hours. No results for input(s): AMMONIA in the last 168 hours.  CBC:  Recent Labs Lab 02/17/16 1045 02/17/16 1059  WBC 8.5  --   NEUTROABS 6.5  --   HGB 16.1* 16.3*  HCT 47.4* 48.0*  MCV 89.3  --   PLT 205  --     Cardiac Enzymes: No results for input(s): CKTOTAL, CKMB, CKMBINDEX, TROPONINI in the last 168 hours.  Lipid Panel: No results for input(s): CHOL, TRIG, HDL, CHOLHDL, VLDL, LDLCALC in the last 168 hours.  CBG: No results for input(s): GLUCAP in the last 168 hours.  Microbiology: No results found for this or any previous visit.  Coagulation Studies:  Recent Labs  02/17/16 1045  LABPROT 13.2  INR 1.00    Imaging: Dg Chest 2 View  Result Date: 02/17/2016 CLINICAL DATA:  Left chest pain EXAM: CHEST  2 VIEW COMPARISON:  None FINDINGS: Lungs are clear.  No pleural effusion of pneumothorax. The heart is normal in size. Old right rib fracture deformities. IMPRESSION: No evidence of acute cardiopulmonary disease. Electronically  Signed   By: Julian Hy M.D.   On: 02/17/2016 11:13   Mr Brain Wo Contrast  Result Date: 02/17/2016 CLINICAL DATA:  75 year old female with hand and perioral numbness since yesterday. Symptoms progressing. Initial encounter. EXAM: MRI HEAD WITHOUT CONTRAST TECHNIQUE: Multiplanar, multiecho pulse sequences of the brain and  surrounding structures were obtained without intravenous contrast. COMPARISON:  None. FINDINGS: Brain: 10 mm focus of restricted diffusion in the lateral left thalamus near the posterior limb of the left internal capsule (series 4, image 27). Faint associated T2 and FLAIR hyperintensity. No associated hemorrhage or mass effect. Elsewhere no other restricted diffusion. No midline shift, mass effect, evidence of mass lesion, extra-axial collection or acute intracranial hemorrhage. Cervicomedullary junction and pituitary are within normal limits. Ventricular prominence which probably is ex vacuo related. Patchy bilateral cerebral white matter T2 and FLAIR hyperintensity. No cortical encephalomalacia. No chronic cerebral blood products identified. Outside of the acute findings mild deep gray matter nuclei T2 heterogeneity. Similar mild T2 heterogeneity in the pons. Cerebellum within normal limits. Vascular: Major intracranial vascular flow voids are preserved with a degree of generalized intracranial artery dolichoectasia. Skull and upper cervical spine: Negative visualized cervical spine. Visualized bone marrow signal is within normal limits. Sinuses/Orbits: Paranasal sinuses and mastoids are clear. Postoperative changes to both globes. Other: Scalp soft tissuesThere is a 3 cm area of left anterior convexity scalp soft tissue swelling which is nonspecific but may have a number of associated small vessels (series 9, image 37 and series 6, image 16). Other scalp soft tissues appear normal. IMPRESSION: 1. Acute lacunar infarct lateral left thalamus near the posterior limb of the left internal  capsule. No associated hemorrhage or mass effect. 2. Patchy chronic appearing signal changes in the cerebral white matter and to a lesser extent deep gray matter nuclei and pons, favor chronic small vessel disease related. 3. 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 Electronically Signed   By: Genevie Ann M.D.   On: 02/17/2016 13:53       Assessment and plan discussed with with attending physician and they are in agreement.    Etta Quill PA-C [facility] [phone]  02/18/2016, 10:19 AM   Assessment: 75 y.o. female with likely small vessel infarct. She will need risk factor modification.   Stroke Risk Factors - none   1) Echo 2) Lipids, statin for LDL > 70 3) ASA 325mg  daily.  4) ST cog eval, no deficits requiring PT or OT noted 5) BP control.  6) CT angio head and neck 7) stroke team to follow.   Roland Rack, MD [facility] [phone]  If 7pm- 7am, please page neurology on call as listed in [place].

## 2016-02-18 NOTE — Progress Notes (Signed)
PROGRESS NOTE    Vanessa Ford  [id number] DOB: 10/16/1940 DOA: 02/17/2016 PCP: Precious Reel, MD   Outpatient Specialists: GI, Dr. Olevia Perches   Brief Narrative:  Vanessa Ford is a 75 y.o. female generally healthy with history of tubular adenoma of the colon and hyperplastic polyp, diverticulosis, osteoporosis,presents to the emergency room with chief complaint of numbness around her mouth, as well as tingling in bilateral hands, as well as difficulties with balance and feeling dizzy. She underwent an MRI of the brain which showed an acute lacunar infarct lateral left thalamus near the posterior limb of the left internal capsule. Neurology was consulted and TRH was asked for admission from [facility] emergency department to [place]:   Active Problems:   CVA (cerebral infarction)   HTN (hypertension)   Hypokalemia   Acute thalamic and internal capsule stroke  - Carotid doppler: Preliminary findings: Bilateral: No significant (1-39%) ICA stenosis. Antegrade vertebral flow. - Stroke team following - Echo unremarkable  - LDL pending  - Ha1c pending - Asa daily  - SLP evaluated patient, no further follow up needed  - CTA head and neck - Moderate right P1/2 segment stenosis. No proximal left PCA finding to explain the acute infarct.  HTN - Permissive HTN in setting of stroke   Hypokalemia - Repeat BMP in morning with Mg   EKG changes  - Patient is nonspecific ST segment changes throughout her EKG, she was told in the past and her EKG is abnormal, she is completely asymptomatic, echo unremarkable  - Troponin negative    Incidental thyroid nodule on left - Needs OP follow up   DVT prophylaxis: lovenox Code Status: Full Family Communication: daughter at bedside Disposition Plan: Will monitor overnight. Discharge in the morning.    Consultants:   Neurology   Procedures:   Echo Study Conclusions - Left ventricle: The cavity size was normal.  Wall thickness was   normal. Systolic function was vigorous. The estimated ejection   fraction was in the range of 65% to 70%. Wall motion was normal;   there were no regional wall motion abnormalities. Doppler   parameters are consistent with abnormal left ventricular   relaxation (grade 1 diastolic dysfunction).  Impressions: - No cardiac source of emboli was indentified.   Carotid US Summary: - The vertebral arteries appear patent with antegrade flow. - Findings consistent with 1- 39 percent stenosis involving the   right internal carotid artery and the left internal carotid   artery.  Antimicrobials:   None    Subjective: Patient quite anxious to go home. She states that all of her numbness, tingling, balance issues, has now resolved completely. She denies any chest pain, shortness of breath, cough, nausea, vomiting, diarrhea, abdominal pain, dysuria.  Objective: Vitals:   02/18/16 0600 02/18/16 0800 02/18/16 1000 02/18/16 1300  BP: (!) 177/99 (!) 170/92 (!) 169/93 (!) 156/88  Pulse: 90 95 95 96  Resp: 15 16 17 18   Temp: 97.5 F (36.4 C) 98.6 F (37 C) 98.2 F (36.8 C) 98.1 F (36.7 C)  TempSrc: Oral Oral Oral Oral  SpO2: 97% 96% 98% 99%  Weight:      Height:        Intake/Output Summary (Last 24 hours) at 02/18/16 1751 Last data filed at 02/18/16 0800  Gross per 24 hour  Intake              240 ml  Output  0 ml  Net              240 ml   Filed Weights   02/17/16 1024 02/17/16 2200  Weight: 65.8 kg (145 lb) 66.7 kg (147 lb)    Examination:  General exam: Appears calm and comfortable  Respiratory system: Clear to auscultation. Respiratory effort normal. Cardiovascular system: S1 & S2 heard, RRR. No JVD, murmurs, rubs, gallops or clicks. No pedal edema. Gastrointestinal system: Abdomen is nondistended, soft and nontender. No organomegaly or masses felt. Normal bowel sounds heard. Central nervous system: Alert and oriented. No focal  neurological deficits. Extremities: Symmetric 5 x 5 power. Skin: No rashes, lesions or ulcers Psychiatry: Judgement and insight appear normal. Mood & affect appropriate.   Data Reviewed: I have personally reviewed following labs and imaging studies  CBC:  Recent Labs Lab 02/17/16 1045 02/17/16 1059  WBC 8.5  --   NEUTROABS 6.5  --   HGB 16.1* 16.3*  HCT 47.4* 48.0*  MCV 89.3  --   PLT 205  --    Basic Metabolic Panel:  Recent Labs Lab 02/17/16 1045 02/17/16 1059  NA 142 142  K 3.5 3.4*  CL 108 104  CO2 24  --   GLUCOSE 116* 118*  BUN 12 11  CREATININE 0.57 0.60  CALCIUM 9.7  --    GFR: Estimated Creatinine Clearance: 63.5 mL/min (by C-G formula based on SCr of 0.6 mg/dL). Liver Function Tests:  Recent Labs Lab 02/17/16 1045  AST 24  ALT 20  ALKPHOS 62  BILITOT 1.1  PROT 7.3  ALBUMIN 4.4   No results for input(s): LIPASE, AMYLASE in the last 168 hours. No results for input(s): AMMONIA in the last 168 hours. Coagulation Profile:  Recent Labs Lab 02/17/16 1045  INR 1.00   Cardiac Enzymes: No results for input(s): CKTOTAL, CKMB, CKMBINDEX, TROPONINI in the last 168 hours. BNP (last 3 results) No results for input(s): PROBNP in the last 8760 hours. HbA1C: No results for input(s): HGBA1C in the last 72 hours. CBG: No results for input(s): GLUCAP in the last 168 hours. Lipid Profile: No results for input(s): CHOL, HDL, LDLCALC, TRIG, CHOLHDL, LDLDIRECT in the last 72 hours. Thyroid Function Tests: No results for input(s): TSH, T4TOTAL, FREET4, T3FREE, THYROIDAB in the last 72 hours. Anemia Panel: No results for input(s): VITAMINB12, FOLATE, FERRITIN, TIBC, IRON, RETICCTPCT in the last 72 hours. Sepsis Labs: No results for input(s): PROCALCITON, LATICACIDVEN in the last 168 hours.  No results found for this or any previous visit (from the past 240 hour(s)).       Radiology Studies: Ct Angio Head W Or Wo Contrast  Result Date:  02/18/2016 CLINICAL DATA:  New onset right facial tingling. Difficulty with balance. EXAM: CT ANGIOGRAPHY HEAD AND NECK TECHNIQUE: Multidetector CT imaging of the head and neck was performed using the standard protocol during bolus administration of intravenous contrast. Multiplanar CT image reconstructions and MIPs were obtained to evaluate the vascular anatomy. Carotid stenosis measurements (when applicable) are obtained utilizing NASCET criteria, using the distal internal carotid diameter as the denominator. CONTRAST:  50 cc Isovue 370 intravenous COMPARISON:  Brain MRI from yesterday FINDINGS: CT HEAD Brain: Known acute infarct in the left lateral thalamus and posterior limb internal capsule is subtly seen. No evidence of progression or hemorrhage. Chronic microvascular disease with confluent ischemic gliosis in the cerebral white matter. Ventriculomegaly with morphology favoring central predominant atrophy over communicating hydrocephalus. Calvarium and skull base: Re- demonstrated serpiginous and nodular  left frontal parietal scalp lesion that enhances on the delayed phase but not the arterial phase. Appearance consistent with low flow vascular malformation. Paranasal sinuses: Clear Orbits: Bilateral cataract resection. CTA NECK Aortic arch: No acute finding or dilatation. Three vessel branching. Atherosclerotic calcification of the great vessel origins. Right carotid system: Minimal atheromatous wall thickening and calcification at the bifurcation. No stenosis, dissection, or ulceration. Left carotid system: Minimal atheromatous wall thickening and calcification at the bifurcation. No stenosis, dissection, or ulceration. Vertebral arteries:No proximal left subclavian artery stenosis. Codominant vertebral arteries that are smooth and widely patent. Skeleton: No acute or aggressive finding. Other neck: 32 mm left thyroid nodule. No invasive features or adenopathy. Upper chest: Clear CTA HEAD Anterior  circulation: Symmetric carotid arteries and branching. Small any posterior communicating arteries. Anterior communicating artery present. No branch occlusion or flow limiting stenosis. No notable atheromatous changes. Posterior circulation: Codominant vertebral arteries with early branching right PICA. Dominant left AICA. Vertebral and basilar arteries are smooth and widely patent. No branch occlusion or flow limiting stenosis. There is a moderate narrowing at the right P1 2 junction. The narrowing has an atheromatous appearance. No visualized dissection or luminal thrombus. Venous sinuses: Patent Anatomic variants: None significant Delayed phase: No parenchymal enhancement or mass. IMPRESSION: 1. No acute arterial finding. 2. Minimal atherosclerosis in the neck with no stenosis. 3. Moderate right P1/2 segment stenosis. No proximal left PCA finding to explain the acute infarct. 4. **An incidental finding of potential clinical significance has been found. 35 mm left thyroid nodule. If appropriate for comorbidities, recommend outpatient thyroid sonography.** 5. 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 Electronically Signed   By: Monte Fantasia M.D.   On: 02/18/2016 15:54   Dg Chest 2 View  Result Date: 02/17/2016 CLINICAL DATA:  Left chest pain EXAM: CHEST  2 VIEW COMPARISON:  None FINDINGS: Lungs are clear.  No pleural effusion of pneumothorax. The heart is normal in size. Old right rib fracture deformities. IMPRESSION: No evidence of acute cardiopulmonary disease. Electronically Signed   By: Julian Hy M.D.   On: 02/17/2016 11:13   Ct Angio Neck W Or Wo Contrast  Result Date: 02/18/2016 CLINICAL DATA:  New onset right facial tingling. Difficulty with balance. EXAM: CT ANGIOGRAPHY HEAD AND NECK TECHNIQUE: Multidetector CT imaging of the head and neck was performed using the standard protocol during bolus administration of intravenous contrast. Multiplanar CT image reconstructions and  MIPs were obtained to evaluate the vascular anatomy. Carotid stenosis measurements (when applicable) are obtained utilizing NASCET criteria, using the distal internal carotid diameter as the denominator. CONTRAST:  50 cc Isovue 370 intravenous COMPARISON:  Brain MRI from yesterday FINDINGS: CT HEAD Brain: Known acute infarct in the left lateral thalamus and posterior limb internal capsule is subtly seen. No evidence of progression or hemorrhage. Chronic microvascular disease with confluent ischemic gliosis in the cerebral white matter. Ventriculomegaly with morphology favoring central predominant atrophy over communicating hydrocephalus. Calvarium and skull base: Re- demonstrated serpiginous and nodular left frontal parietal scalp lesion that enhances on the delayed phase but not the arterial phase. Appearance consistent with low flow vascular malformation. Paranasal sinuses: Clear Orbits: Bilateral cataract resection. CTA NECK Aortic arch: No acute finding or dilatation. Three vessel branching. Atherosclerotic calcification of the great vessel origins. Right carotid system: Minimal atheromatous wall thickening and calcification at the bifurcation. No stenosis, dissection, or ulceration. Left carotid system: Minimal atheromatous wall thickening and calcification at the bifurcation. No stenosis, dissection, or ulceration. Vertebral arteries:No  proximal left subclavian artery stenosis. Codominant vertebral arteries that are smooth and widely patent. Skeleton: No acute or aggressive finding. Other neck: 32 mm left thyroid nodule. No invasive features or adenopathy. Upper chest: Clear CTA HEAD Anterior circulation: Symmetric carotid arteries and branching. Small any posterior communicating arteries. Anterior communicating artery present. No branch occlusion or flow limiting stenosis. No notable atheromatous changes. Posterior circulation: Codominant vertebral arteries with early branching right PICA. Dominant left AICA.  Vertebral and basilar arteries are smooth and widely patent. No branch occlusion or flow limiting stenosis. There is a moderate narrowing at the right P1 2 junction. The narrowing has an atheromatous appearance. No visualized dissection or luminal thrombus. Venous sinuses: Patent Anatomic variants: None significant Delayed phase: No parenchymal enhancement or mass. IMPRESSION: 1. No acute arterial finding. 2. Minimal atherosclerosis in the neck with no stenosis. 3. Moderate right P1/2 segment stenosis. No proximal left PCA finding to explain the acute infarct. 4. **An incidental finding of potential clinical significance has been found. 35 mm left thyroid nodule. If appropriate for comorbidities, recommend outpatient thyroid sonography.** 5. 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 Electronically Signed   By: Monte Fantasia M.D.   On: 02/18/2016 15:54   Mr Brain Wo Contrast  Result Date: 02/17/2016 CLINICAL DATA:  75 year old female with hand and perioral numbness since yesterday. Symptoms progressing. Initial encounter. EXAM: MRI HEAD WITHOUT CONTRAST TECHNIQUE: Multiplanar, multiecho pulse sequences of the brain and surrounding structures were obtained without intravenous contrast. COMPARISON:  None. FINDINGS: Brain: 10 mm focus of restricted diffusion in the lateral left thalamus near the posterior limb of the left internal capsule (series 4, image 27). Faint associated T2 and FLAIR hyperintensity. No associated hemorrhage or mass effect. Elsewhere no other restricted diffusion. No midline shift, mass effect, evidence of mass lesion, extra-axial collection or acute intracranial hemorrhage. Cervicomedullary junction and pituitary are within normal limits. Ventricular prominence which probably is ex vacuo related. Patchy bilateral cerebral white matter T2 and FLAIR hyperintensity. No cortical encephalomalacia. No chronic cerebral blood products identified. Outside of the acute findings mild  deep gray matter nuclei T2 heterogeneity. Similar mild T2 heterogeneity in the pons. Cerebellum within normal limits. Vascular: Major intracranial vascular flow voids are preserved with a degree of generalized intracranial artery dolichoectasia. Skull and upper cervical spine: Negative visualized cervical spine. Visualized bone marrow signal is within normal limits. Sinuses/Orbits: Paranasal sinuses and mastoids are clear. Postoperative changes to both globes. Other: Scalp soft tissuesThere is a 3 cm area of left anterior convexity scalp soft tissue swelling which is nonspecific but may have a number of associated small vessels (series 9, image 37 and series 6, image 16). Other scalp soft tissues appear normal. IMPRESSION: 1. Acute lacunar infarct lateral left thalamus near the posterior limb of the left internal capsule. No associated hemorrhage or mass effect. 2. Patchy chronic appearing signal changes in the cerebral white matter and to a lesser extent deep gray matter nuclei and pons, favor chronic small vessel disease related. 3. 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 Electronically Signed   By: Genevie Ann M.D.   On: 02/17/2016 13:53        Scheduled Meds: . [START ON 02/19/2016] aspirin EC  325 mg Oral Daily  . enoxaparin (LOVENOX) injection  40 mg Subcutaneous Q24H  . multivitamin with minerals  1 tablet Oral Daily   Continuous Infusions:  LOS: 1 day    Time spent: 40 minutes    Dessa Phi, DO [facility] Pager [phone]  If 7PM-7AM, please contact night-coverage [email or web address] Password [facility] 02/18/2016, 5:51 PM

## 2016-02-18 NOTE — Progress Notes (Signed)
  Echocardiogram 2D Echocardiogram has been performed.  Diamond Nickel 02/18/2016, 12:57 PM

## 2016-02-18 NOTE — Evaluation (Signed)
Speech Language Pathology Evaluation Patient Details Name: Vanessa Ford MRN: [id number] DOB: 1940/06/30 Today's Date: 02/18/2016 Time: 1400-1450 SLP Time Calculation (min) (ACUTE ONLY): 50 min  Problem List:  Patient Active Problem List   Diagnosis Date Noted  . CVA (cerebral infarction) 02/17/2016  . Odynophagia 10/13/2012   Past Medical History:  Past Medical History:  Diagnosis Date  . Diverticulosis   . Fibroid   . Hyperplastic colon polyp 2004  . Osteoporosis   . PONV (postoperative nausea and vomiting)   . Tubular adenoma of colon 2004   Past Surgical History:  Past Surgical History:  Procedure Laterality Date  . FACIAL COSMETIC SURGERY    . NASAL SEPTUM SURGERY    . SQUAMOUS CELL CARCINOMA EXCISION     face, hand  . UTERINE FIBROID SURGERY     HPI:  75 y.o.femaleis in the hospital secondary to noticing new onset right facial tingling and some difficulty is with her balance and feeling dizzy. Currently the symptoms have all but subsided other than bilateral tingling in her fingers. Patient states that she was taking aspirin 81 mg daily in the past but not currently. She states that her blood pressure is usually systolically in the [id number] and diastolically in the [id number]. She is very active and walks daily along with water aerobics. She states she is not a diabetic but is unsure about her cholesterol. MRI was obtained showing a small thalamic and internal capsule stroke.   Assessment / Plan / Recommendation Clinical Impression  Lengthy evaluation of cognition/language completed.  Pt presents with normal language abilities, fluent output, good articulation.  Her daughter describes mild short-term memory difficulty the last two years, but not so much that it interferes with the patient's independency or ability to work.  During the assessment, she demonstrated mild deficits in working memory- pt uses mnemonic devices to assist with storage/retrieval.  Demonstrates good  insight/awareness; mild difficulty with visual construction (copying cube with intersecting lines.)  No formalized SLP f/u is warranted - pt agrees with results/recs.      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)   Cognition  Overall Cognitive Status: Within Functional Limits for tasks assessed Arousal/Alertness: Awake/alert Orientation Level: Oriented X4 Attention: Selective Selective Attention: Appears intact Memory: Appears intact Awareness: Appears intact Problem Solving: Appears intact Executive Function: Reasoning Reasoning: Appears intact Safety/Judgment: Appears intact    Comprehension  Auditory Comprehension Overall Auditory Comprehension: Appears within functional limits for tasks assessed Visual Recognition/Discrimination Discrimination: Within Function Limits Reading Comprehension Reading Status: Within funtional limits    Expression Expression Primary Mode of Expression: Verbal Verbal Expression Overall Verbal Expression: Appears within functional limits for tasks assessed Written Expression Dominant Hand: Right Written Expression: Within Functional Limits   Oral / Motor  Oral Motor/Sensory Function Overall Oral Motor/Sensory Function: Within functional limits Motor Speech Overall Motor Speech: Appears within functional limits for tasks assessed   GO                    Vanessa Ford 02/18/2016, 3:03 PM

## 2016-02-18 NOTE — Evaluation (Signed)
Physical Therapy Evaluation Patient Details Name: Vanessa Ford MRN: [id number] DOB: July 03, 1940 Today's Date: 02/18/2016   History of Present Illness  pt is a 75 y/o female with h/o osteoporosis admitted with complaint of numbness aroun the mouth and tingling in bil hands as well as balance difficulties and feeling dizzy.  MRI showed acute lacunar infarct of lateral left thalamus near posterior limb of the internal capsule.  Clinical Impression  Pt admitted with/for s/s of stroke confirmed to be acute lacunar infarct on MRI.  Pt currently limited functionally due to the problems listed below.  (see problems list.)  Pt will benefit from PT to maximize function and safety to be able to get home safely with available assist of family.  Pt is at and Mod I to supervision level at this time.      Follow Up Recommendations Outpatient PT;Supervision - Intermittent    Equipment Recommendations  None recommended by PT    Recommendations for Other Services       Precautions / Restrictions Precautions Precautions: Fall      Mobility  Bed Mobility Overal bed mobility: Modified Independent                Transfers Overall transfer level: Needs assistance   Transfers: Sit to/from Stand Sit to Stand: Modified independent (Device/Increase time)            Ambulation/Gait Ambulation/Gait assistance: Supervision;Min guard Ambulation Distance (Feet): 400 Feet Assistive device: None Gait Pattern/deviations: Step-through pattern Gait velocity: functional for community Gait velocity interpretation: at or above normal speed for age/gender General Gait Details: generally steady until heavily challenged ie scanning, then some instability.  See DGI  Stairs Stairs: Yes Stairs assistance: Supervision Stair Management: One rail Right;Two rails;Alternating pattern;Forwards Number of Stairs: 4 General stair comments: steady with rails  Wheelchair Mobility    Modified Rankin (Stroke  Patients Only) Modified Rankin (Stroke Patients Only) Pre-Morbid Rankin Score: No symptoms Modified Rankin: Moderate disability     Balance Overall balance assessment: Needs assistance Sitting-balance support: No upper extremity supported Sitting balance-Leahy Scale: Good     Standing balance support: No upper extremity supported Standing balance-Leahy Scale: Good Standing balance comment: manage several BERG items without overt LOB incl 360*, narrow BOS with eyes closed, reaching, tandem standing with prep and standing 1 foot.                 Standardized Balance Assessment Standardized Balance Assessment : Dynamic Gait Index   Dynamic Gait Index Level Surface: Normal Change in Gait Speed: Normal Gait with Horizontal Head Turns: Moderate Impairment Gait with Vertical Head Turns: Mild Impairment Gait and Pivot Turn: Normal Step Over Obstacle: Mild Impairment Step Around Obstacles: Normal Steps: Mild Impairment Total Score: 19       Pertinent Vitals/Pain Pain Assessment: No/denies pain    Home Living Family/patient expects to be discharged to:: Private residence Living Arrangements: Alone Available Help at Discharge: Available PRN/intermittently;Family Type of Home: House Home Access: Stairs to enter Entrance Stairs-Rails: Psychiatric nurse of Steps: several Home Layout: Two level;Bed/bath upstairs   Additional Comments: active Field seismologist, works out with a Clinical research associate    Prior Function Level of Independence: Independent               Journalist, newspaper        Extremity/Trunk Assessment   Upper Extremity Assessment: Defer to OT evaluation           Lower Extremity Assessment: Overall WFL for tasks assessed  Communication   Communication: No difficulties  Cognition Arousal/Alertness: Awake/alert Behavior During Therapy: WFL for tasks assessed/performed Overall Cognitive Status: Within Functional Limits for tasks  assessed                      General Comments General comments (skin integrity, edema, etc.): scored 19 with mild prediction of fall risk,  mostly seen with degredation in balance with scanning.    Exercises        Assessment/Plan    PT Assessment Patient needs continued PT services  PT Diagnosis Other (comment) (balance challenge)   PT Problem List Decreased activity tolerance;Decreased balance;Decreased mobility  PT Treatment Interventions Gait training;Functional mobility training;Therapeutic activities;Balance training;Patient/family education   PT Goals (Current goals can be found in the Care Plan section) Acute Rehab PT Goals Patient Stated Goal: back home today PT Goal Formulation: With patient Time For Goal Achievement: 02/25/16 Potential to Achieve Goals: Good    Frequency Min 3X/week   Barriers to discharge        Co-evaluation               End of Session   Activity Tolerance: Patient tolerated treatment well Patient left: Other (comment) (W/C to echo) Nurse Communication: Mobility status         Time: [id number] PT Time Calculation (min) (ACUTE ONLY): 24 min   Charges:   PT Evaluation $PT Eval Low Complexity: 1 Procedure PT Treatments $Gait Training: 8-22 mins   PT G Codes:        Aram Domzalski, Tessie Fass 02/18/2016, 12:38 PM 02/18/2016  Donnella Sham, PT [phone]  (pager)

## 2016-02-18 NOTE — Progress Notes (Signed)
OT Cancellation Note  Patient Details Name: KARRIGAN SUGAWARA MRN: [id number] DOB: 1940/06/15   Cancelled Treatment:    Reason Eval/Treat Not Completed: Patient at procedure or test/ unavailable (CT).  Binnie Kand M.S., OTR/L Pager: [phone]  02/18/2016, 4:58 PM

## 2016-02-18 NOTE — Evaluation (Signed)
SLP Cancellation Note  Patient Details Name: KEILLY GIDDINGS MRN: [id number] DOB: Apr 29, 1941   Cancelled treatment:       Reason Eval/Treat Not Completed: Other (comment) (pt working with nursing at this time, will reattempt evaluation at later time)   Claudie Fisherman, [facility] SLP [phone]

## 2016-02-19 DIAGNOSIS — E785 Hyperlipidemia, unspecified: Secondary | ICD-10-CM

## 2016-02-19 DIAGNOSIS — I63332 Cerebral infarction due to thrombosis of left posterior cerebral artery: Secondary | ICD-10-CM

## 2016-02-19 LAB — VITAMIN B12: VITAMIN B 12: 1064 pg/mL — AB (ref 180–914)

## 2016-02-19 LAB — CBC WITH DIFFERENTIAL/PLATELET
BASOS PCT: 1 %
Basophils Absolute: 0.1 10*3/uL (ref 0.0–0.1)
Eosinophils Absolute: 0.1 10*3/uL (ref 0.0–0.7)
Eosinophils Relative: 1 %
HEMATOCRIT: 49.3 % — AB (ref 36.0–46.0)
HEMOGLOBIN: 16 g/dL — AB (ref 12.0–15.0)
LYMPHS ABS: 1.9 10*3/uL (ref 0.7–4.0)
LYMPHS PCT: 27 %
MCH: 29.5 pg (ref 26.0–34.0)
MCHC: 32.5 g/dL (ref 30.0–36.0)
MCV: 91 fL (ref 78.0–100.0)
MONO ABS: 0.7 10*3/uL (ref 0.1–1.0)
MONOS PCT: 10 %
NEUTROS ABS: 4.4 10*3/uL (ref 1.7–7.7)
NEUTROS PCT: 61 %
PLATELETS: 216 10*3/uL (ref 150–400)
RBC: 5.42 MIL/uL — ABNORMAL HIGH (ref 3.87–5.11)
RDW: 13 % (ref 11.5–15.5)
WBC: 7.2 10*3/uL (ref 4.0–10.5)

## 2016-02-19 LAB — LIPID PANEL
Cholesterol: 219 mg/dL — ABNORMAL HIGH (ref 0–200)
HDL: 84 mg/dL (ref >40)
LDL CALC: 123 mg/dL — AB (ref 0–99)
Total CHOL/HDL Ratio: 2.6 RATIO
Triglycerides: 59 mg/dL (ref <150)
VLDL: 12 mg/dL (ref 0–40)

## 2016-02-19 LAB — BASIC METABOLIC PANEL
Anion gap: 7 (ref 5–15)
BUN: 9 mg/dL (ref 6–20)
CALCIUM: 9.8 mg/dL (ref 8.9–10.3)
CHLORIDE: 105 mmol/L (ref 101–111)
CO2: 28 mmol/L (ref 22–32)
CREATININE: 0.61 mg/dL (ref 0.44–1.00)
Glucose, Bld: 113 mg/dL — ABNORMAL HIGH (ref 65–99)
Potassium: 3.9 mmol/L (ref 3.5–5.1)
SODIUM: 140 mmol/L (ref 135–145)

## 2016-02-19 LAB — MAGNESIUM: MAGNESIUM: 2 mg/dL (ref 1.7–2.4)

## 2016-02-19 LAB — HEMOGLOBIN A1C
HEMOGLOBIN A1C: 5.4 % (ref 4.8–5.6)
Mean Plasma Glucose: 108 mg/dL

## 2016-02-19 LAB — TSH: TSH: 2.453 u[IU]/mL (ref 0.350–4.500)

## 2016-02-19 MED ORDER — AMLODIPINE BESYLATE 5 MG PO TABS: 5.0000 mg | ORAL_TABLET | Freq: Every day | ORAL | 0 refills | Status: DC

## 2016-02-19 MED ORDER — AMLODIPINE BESYLATE 5 MG PO TABS
5.0000 mg | ORAL_TABLET | Freq: Every day | ORAL | Status: DC
  Administered 2016-02-19: 5 mg via ORAL
  Filled 2016-02-19: qty 1

## 2016-02-19 MED ORDER — ASPIRIN 325 MG PO TBEC: 325.0000 mg | DELAYED_RELEASE_TABLET | Freq: Every day | ORAL | 0 refills | Status: AC

## 2016-02-19 NOTE — Progress Notes (Signed)
Patient has been discharged home. Discharge instructions given, and patient verbalized understanding. Arta Silence, RN  02/19/2016 11:01 AM

## 2016-02-19 NOTE — Care Management Note (Signed)
Case Management Note  Patient Details  Name: Vanessa Ford MRN: [id number] Date of Birth: 1941/02/22  Subjective/Objective:     Patient discharged before NCM was able to see patient, NCM called patient on phone, informed her that out patient pt was recommended by physical therapy, patient states she feels like she does not need outpatient physical therapy.                 Action/Plan:   Expected Discharge Date:   (unknown)               Expected Discharge Plan:  Home/Self Care  In-House Referral:     Discharge planning Services  CM Consult  Post Acute Care Choice:    Choice offered to:     DME Arranged:    DME Agency:     HH Arranged:    HH Agency:     Status of Service:  Completed, signed off  If discussed at [employer] of Stay Meetings, dates discussed:    Additional Comments:  Zenon Mayo, RN 02/19/2016, 11:36 AM

## 2016-02-19 NOTE — Discharge Summary (Signed)
Physician Discharge Summary  Vanessa Ford [id number] DOB: 30-Aug-1940 DOA: 02/17/2016  PCP: Vanessa Reel, MD  Brent General, MD at [facility] date: 02/17/2016 Discharge date: 02/19/2016  Admitted From: Home  Disposition:  Home   Recommendations for Outpatient Follow-up:  1. Follow up with PCP in 1-2 weeks 2. Follow up with Neurology in 6 weeks - Dr. Erlinda Hong  3. Stop provera/estrogen  4. Follow up incidental thyroid nodule on left   5. Follow up lipid panel - will need statin for LDL > 70  6. Aspirin 325mg  daily  7. Start Norvasc 5mg  daily - will need close follow up for blood pressure control   Home Health: No Equipment/Devices: No  Discharge Condition: Stable CODE STATUS: Full Diet recommendation: Regular  Brief/Interim Summary: Vanessa Ford a 75 y.o.femalegenerally healthy with history of tubular adenoma of the colon and hyperplastic polyp, diverticulosis, osteoporosis,presented to the [facility] emergency room with chief complaint of numbness around her mouth, as well as tingling in bilateral hands, as well as difficulties with balance and feeling dizzy. She underwent an MRI of the brain which showed an acute lacunar infarct lateral left thalamus near the posterior limb of the left internal capsule. Neurology was consulted and TRH was asked for admission from [facility] emergency department to [facility].  Discharge Diagnoses:  Active Problems:   CVA (cerebral infarction)   HTN (hypertension)   Hypokalemia   Acute thalamic and internal capsule stroke  - Carotid doppler Bilateral: No significant (1-39%) ICA stenosis. Antegrade vertebral flow - Echo unremarkable  - CTA head and neck - Moderate right P1/2 segment stenosis. No proximal left PCA finding to explain the acute infarct - Stroke team following - LDL pending  - Ha1c 5.4 - Asa daily - increase from baby aspirin to full dose  - SLP evaluated patient, no further follow up needed  - No deficits requiring PT OT    HTN - Permissive HTN in setting of stroke  - Will start Norvasc for better BP control   Hypokalemia - Repeat BMP in morning with Mg -wnl this morning   EKG changes  - Patient is nonspecific ST segment changes throughout her EKG, she was told in the past and her EKG is abnormal, she is completely asymptomatic, echo unremarkable  - Troponin negative    Incidental thyroid nodule on left - Needs OP follow up    Discharge Instructions  Discharge Instructions    Call MD for:    Complete by:  As directed    Recurrent stroke-like symptoms   Diet - low sodium heart healthy    Complete by:  As directed    Discharge instructions    Complete by:  As directed    Recommendations for Outpatient Follow-up:  Follow up with PCP in 1-2 weeks Follow up with Neurology in 6 weeks - Dr. Erlinda Hong  Stop provera/estrace  Follow up incidental thyroid nodule on left   Follow up lipid panel - will need statin for LDL > 70  Increase baby aspirin to full dose aspirin 325mg  daily  Start Norvasc 5mg  daily - will need close follow up for blood pressure control   Increase activity slowly    Complete by:  As directed        Medication List    STOP taking these medications   estradiol 2 MG tablet Commonly known as:  ESTRACE   medroxyPROGESTERone 5 MG tablet Commonly known as:  PROVERA     TAKE these  medications   amLODipine 5 MG tablet Commonly known as:  NORVASC Take 1 tablet (5 mg total) by mouth daily.   aspirin 325 MG EC tablet Take 1 tablet (325 mg total) by mouth daily.   B-12 PO Take 1 tablet by mouth daily.   CRANBERRY PO Take 1 tablet by mouth daily.   ECHINACEA PO Take 1 tablet by mouth daily.   ibuprofen 200 MG tablet Commonly known as:  ADVIL,MOTRIN Take 400 mg by mouth every 6 (six) hours as needed for headache.   multivitamin with minerals Tabs tablet Take 1 tablet by mouth daily.       Allergies  Allergen Reactions  . Doxycycline Other (See Comments)     esophagitis  . Other Itching and Rash    Band aids    Consultations:  Neurology    Procedures/Studies: Ct Angio Head W Or Wo Contrast  Result Date: 02/18/2016 CLINICAL DATA:  New onset right facial tingling. Difficulty with balance. EXAM: CT ANGIOGRAPHY HEAD AND NECK TECHNIQUE: Multidetector CT imaging of the head and neck was performed using the standard protocol during bolus administration of intravenous contrast. Multiplanar CT image reconstructions and MIPs were obtained to evaluate the vascular anatomy. Carotid stenosis measurements (when applicable) are obtained utilizing NASCET criteria, using the distal internal carotid diameter as the denominator. CONTRAST:  50 cc Isovue 370 intravenous COMPARISON:  Brain MRI from yesterday FINDINGS: CT HEAD Brain: Known acute infarct in the left lateral thalamus and posterior limb internal capsule is subtly seen. No evidence of progression or hemorrhage. Chronic microvascular disease with confluent ischemic gliosis in the cerebral white matter. Ventriculomegaly with morphology favoring central predominant atrophy over communicating hydrocephalus. Calvarium and skull base: Re- demonstrated serpiginous and nodular left frontal parietal scalp lesion that enhances on the delayed phase but not the arterial phase. Appearance consistent with low flow vascular malformation. Paranasal sinuses: Clear Orbits: Bilateral cataract resection. CTA NECK Aortic arch: No acute finding or dilatation. Three vessel branching. Atherosclerotic calcification of the great vessel origins. Right carotid system: Minimal atheromatous wall thickening and calcification at the bifurcation. No stenosis, dissection, or ulceration. Left carotid system: Minimal atheromatous wall thickening and calcification at the bifurcation. No stenosis, dissection, or ulceration. Vertebral arteries:No proximal left subclavian artery stenosis. Codominant vertebral arteries that are smooth and widely patent.  Skeleton: No acute or aggressive finding. Other neck: 32 mm left thyroid nodule. No invasive features or adenopathy. Upper chest: Clear CTA HEAD Anterior circulation: Symmetric carotid arteries and branching. Small any posterior communicating arteries. Anterior communicating artery present. No branch occlusion or flow limiting stenosis. No notable atheromatous changes. Posterior circulation: Codominant vertebral arteries with early branching right PICA. Dominant left AICA. Vertebral and basilar arteries are smooth and widely patent. No branch occlusion or flow limiting stenosis. There is a moderate narrowing at the right P1 2 junction. The narrowing has an atheromatous appearance. No visualized dissection or luminal thrombus. Venous sinuses: Patent Anatomic variants: None significant Delayed phase: No parenchymal enhancement or mass. IMPRESSION: 1. No acute arterial finding. 2. Minimal atherosclerosis in the neck with no stenosis. 3. Moderate right P1/2 segment stenosis. No proximal left PCA finding to explain the acute infarct. 4. **An incidental finding of potential clinical significance has been found. 35 mm left thyroid nodule. If appropriate for comorbidities, recommend outpatient thyroid sonography.** 5. 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 Electronically Signed   By: Monte Fantasia M.D.   On: 02/18/2016 15:54   Dg Chest 2 View  Result  Date: 02/17/2016 CLINICAL DATA:  Left chest pain EXAM: CHEST  2 VIEW COMPARISON:  None FINDINGS: Lungs are clear.  No pleural effusion of pneumothorax. The heart is normal in size. Old right rib fracture deformities. IMPRESSION: No evidence of acute cardiopulmonary disease. Electronically Signed   By: Julian Hy M.D.   On: 02/17/2016 11:13   Ct Angio Neck W Or Wo Contrast  Result Date: 02/18/2016 CLINICAL DATA:  New onset right facial tingling. Difficulty with balance. EXAM: CT ANGIOGRAPHY HEAD AND NECK TECHNIQUE: Multidetector CT imaging of  the head and neck was performed using the standard protocol during bolus administration of intravenous contrast. Multiplanar CT image reconstructions and MIPs were obtained to evaluate the vascular anatomy. Carotid stenosis measurements (when applicable) are obtained utilizing NASCET criteria, using the distal internal carotid diameter as the denominator. CONTRAST:  50 cc Isovue 370 intravenous COMPARISON:  Brain MRI from yesterday FINDINGS: CT HEAD Brain: Known acute infarct in the left lateral thalamus and posterior limb internal capsule is subtly seen. No evidence of progression or hemorrhage. Chronic microvascular disease with confluent ischemic gliosis in the cerebral white matter. Ventriculomegaly with morphology favoring central predominant atrophy over communicating hydrocephalus. Calvarium and skull base: Re- demonstrated serpiginous and nodular left frontal parietal scalp lesion that enhances on the delayed phase but not the arterial phase. Appearance consistent with low flow vascular malformation. Paranasal sinuses: Clear Orbits: Bilateral cataract resection. CTA NECK Aortic arch: No acute finding or dilatation. Three vessel branching. Atherosclerotic calcification of the great vessel origins. Right carotid system: Minimal atheromatous wall thickening and calcification at the bifurcation. No stenosis, dissection, or ulceration. Left carotid system: Minimal atheromatous wall thickening and calcification at the bifurcation. No stenosis, dissection, or ulceration. Vertebral arteries:No proximal left subclavian artery stenosis. Codominant vertebral arteries that are smooth and widely patent. Skeleton: No acute or aggressive finding. Other neck: 32 mm left thyroid nodule. No invasive features or adenopathy. Upper chest: Clear CTA HEAD Anterior circulation: Symmetric carotid arteries and branching. Small any posterior communicating arteries. Anterior communicating artery present. No branch occlusion or flow  limiting stenosis. No notable atheromatous changes. Posterior circulation: Codominant vertebral arteries with early branching right PICA. Dominant left AICA. Vertebral and basilar arteries are smooth and widely patent. No branch occlusion or flow limiting stenosis. There is a moderate narrowing at the right P1 2 junction. The narrowing has an atheromatous appearance. No visualized dissection or luminal thrombus. Venous sinuses: Patent Anatomic variants: None significant Delayed phase: No parenchymal enhancement or mass. IMPRESSION: 1. No acute arterial finding. 2. Minimal atherosclerosis in the neck with no stenosis. 3. Moderate right P1/2 segment stenosis. No proximal left PCA finding to explain the acute infarct. 4. **An incidental finding of potential clinical significance has been found. 35 mm left thyroid nodule. If appropriate for comorbidities, recommend outpatient thyroid sonography.** 5. 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 Electronically Signed   By: Monte Fantasia M.D.   On: 02/18/2016 15:54   Mr Brain Wo Contrast  Result Date: 02/17/2016 CLINICAL DATA:  75 year old female with hand and perioral numbness since yesterday. Symptoms progressing. Initial encounter. EXAM: MRI HEAD WITHOUT CONTRAST TECHNIQUE: Multiplanar, multiecho pulse sequences of the brain and surrounding structures were obtained without intravenous contrast. COMPARISON:  None. FINDINGS: Brain: 10 mm focus of restricted diffusion in the lateral left thalamus near the posterior limb of the left internal capsule (series 4, image 27). Faint associated T2 and FLAIR hyperintensity. No associated hemorrhage or mass effect. Elsewhere no other restricted diffusion.  No midline shift, mass effect, evidence of mass lesion, extra-axial collection or acute intracranial hemorrhage. Cervicomedullary junction and pituitary are within normal limits. Ventricular prominence which probably is ex vacuo related. Patchy bilateral  cerebral white matter T2 and FLAIR hyperintensity. No cortical encephalomalacia. No chronic cerebral blood products identified. Outside of the acute findings mild deep gray matter nuclei T2 heterogeneity. Similar mild T2 heterogeneity in the pons. Cerebellum within normal limits. Vascular: Major intracranial vascular flow voids are preserved with a degree of generalized intracranial artery dolichoectasia. Skull and upper cervical spine: Negative visualized cervical spine. Visualized bone marrow signal is within normal limits. Sinuses/Orbits: Paranasal sinuses and mastoids are clear. Postoperative changes to both globes. Other: Scalp soft tissuesThere is a 3 cm area of left anterior convexity scalp soft tissue swelling which is nonspecific but may have a number of associated small vessels (series 9, image 37 and series 6, image 16). Other scalp soft tissues appear normal. IMPRESSION: 1. Acute lacunar infarct lateral left thalamus near the posterior limb of the left internal capsule. No associated hemorrhage or mass effect. 2. Patchy chronic appearing signal changes in the cerebral white matter and to a lesser extent deep gray matter nuclei and pons, favor chronic small vessel disease related. 3. 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 Electronically Signed   By: Genevie Ann M.D.   On: 02/17/2016 13:53   Carotid US  Summary: - The vertebral arteries appear patent with antegrade flow. - Findings consistent with 1- 39 percent stenosis involving the   right internal carotid artery and the left internal carotid   artery.  Echo Study Conclusions - Left ventricle: The cavity size was normal. Wall thickness was   normal. Systolic function was vigorous. The estimated ejection   fraction was in the range of 65% to 70%. Wall motion was normal;   there were no regional wall motion abnormalities. Doppler   parameters are  consistent with abnormal left ventricular   relaxation (grade 1 diastolic dysfunction).  Impressions: - No cardiac source of emboli was indentified.  Subjective: Patient doing well this morning, sitting in chair, eating breakfast. Her symptoms of tingling, numbness, unsteadiness has not returned. Her BP has been quite elevated during admission. She states that her BP is always high and she tries lifestyle modifications and meditation because she does not like taking medications. We discussed decreasing risk factors for future strokes; these including aspirin, BP control, cholesterol lowering medications, exercise, diet. She is quite adamant about going home this morning, even though neurology has not had a chance to see her this morning. We discussed the importance of following up with her PCP regarding her lipid panel, blood pressure control, as well as following up with neurology outpatient as well. All questions were answered.   Discharge Exam: Vitals:   02/19/16 0529 02/19/16 0918  BP: (!) 176/94 (!) 168/84  Pulse: 82 88  Resp: 20 20  Temp: 98.3 F (36.8 C) 98.3 F (36.8 C)   Vitals:   02/18/16 2106 02/19/16 0121 02/19/16 0529 02/19/16 0918  BP: (!) 175/92 (!) 160/99 (!) 176/94 (!) 168/84  Pulse: 93 91 82 88  Resp: 18 18 20 20   Temp: 98.7 F (37.1 C) 97.8 F (36.6 C) 98.3 F (36.8 C) 98.3 F (36.8 C)  TempSrc: Oral Oral Oral Oral  SpO2: 96% 99% 96% 100%  Weight:      Height:  General exam: Appears calm and comfortable  Respiratory system: Clear to auscultation. Respiratory effort normal. Cardiovascular system: S1 & S2 heard, RRR. No JVD, murmurs, rubs, gallops or clicks. No pedal edema. Gastrointestinal system: Abdomen is nondistended, soft and nontender. No organomegaly or masses felt. Normal bowel sounds heard. Central nervous system: Alert and oriented. No focal neurological deficits. Extremities: Symmetric 5 x 5 power. Skin: No rashes, lesions or  ulcers Psychiatry: Judgement and insight appear normal. Mood & affect appropriate.    The results of significant diagnostics from this hospitalization (including imaging, microbiology, ancillary and laboratory) are listed below for reference.     Microbiology: No results found for this or any previous visit (from the past 240 hour(s)).   Labs: BNP (last 3 results) No results for input(s): BNP in the last 8760 hours. Basic Metabolic Panel:  Recent Labs Lab 02/17/16 1045 02/17/16 1059 02/19/16 0757  NA 142 142 140  K 3.5 3.4* 3.9  CL 108 104 105  CO2 24  --  28  GLUCOSE 116* 118* 113*  BUN 12 11 9   CREATININE 0.57 0.60 0.61  CALCIUM 9.7  --  9.8  MG  --   --  2.0   Liver Function Tests:  Recent Labs Lab 02/17/16 1045  AST 24  ALT 20  ALKPHOS 62  BILITOT 1.1  PROT 7.3  ALBUMIN 4.4   No results for input(s): LIPASE, AMYLASE in the last 168 hours. No results for input(s): AMMONIA in the last 168 hours. CBC:  Recent Labs Lab 02/17/16 1045 02/17/16 1059 02/19/16 0757  WBC 8.5  --  7.2  NEUTROABS 6.5  --  4.4  HGB 16.1* 16.3* 16.0*  HCT 47.4* 48.0* 49.3*  MCV 89.3  --  91.0  PLT 205  --  216   Cardiac Enzymes: No results for input(s): CKTOTAL, CKMB, CKMBINDEX, TROPONINI in the last 168 hours. BNP: Invalid input(s): POCBNP CBG: No results for input(s): GLUCAP in the last 168 hours. D-Dimer No results for input(s): DDIMER in the last 72 hours. Hgb A1c  Recent Labs  02/18/16 1814  HGBA1C 5.4   Lipid Profile No results for input(s): CHOL, HDL, LDLCALC, TRIG, CHOLHDL, LDLDIRECT in the last 72 hours. Thyroid function studies No results for input(s): TSH, T4TOTAL, T3FREE, THYROIDAB in the last 72 hours.  Invalid input(s): FREET3 Anemia work up No results for input(s): VITAMINB12, FOLATE, FERRITIN, TIBC, IRON, RETICCTPCT in the last 72 hours. Urinalysis    Component Value Date/Time   COLORURINE YELLOW 02/17/2016 [place] 02/17/2016  1041   LABSPEC 1.009 02/17/2016 1041   PHURINE 6.5 02/17/2016 1041   GLUCOSEU NEGATIVE 02/17/2016 1041   HGBUR NEGATIVE 02/17/2016 1041   [place] 02/17/2016 1041   [place] 02/17/2016 1041   PROTEINUR NEGATIVE 02/17/2016 1041   NITRITE NEGATIVE 02/17/2016 1041   LEUKOCYTESUR MODERATE (A) 02/17/2016 1041   Sepsis Labs Invalid input(s): PROCALCITONIN,  WBC,  LACTICIDVEN Microbiology No results found for this or any previous visit (from the past 240 hour(s)).   Time coordinating discharge: Over 30 minutes  SIGNED:  Dessa Phi, DO [facility] Pager [phone]  If 7PM-7AM, please contact night-coverage [email or web address] Password [email or web address] 02/19/2016, 9:35 AM

## 2016-02-19 NOTE — Progress Notes (Signed)
STROKE TEAM PROGRESS NOTE   SUBJECTIVE (INTERVAL HISTORY) Her RN is at the bedside.  Overall she feels her condition is rapidly improving. She is dressed up and ready to be taken downstairs for home ride. She stated that she started to feel perioral and lip as well as both hand tingling yesterday and now improved. Her MRI showed left thalamic infarct. She was on ASA but not taking for a while. She follows with [facility] and her cholesterol was reported well controlled. She refused to start statin this time.    OBJECTIVE Temp:  [97.8 F (36.6 C)-98.7 F (37.1 C)] 98.3 F (36.8 C) (09/14 0529) Pulse Rate:  [82-96] 82 (09/14 0529) Cardiac Rhythm: Normal sinus rhythm (09/13 1900) Resp:  [17-20] 20 (09/14 0529) BP: (156-176)/(88-99) 176/94 (09/14 0529) SpO2:  [96 %-99 %] 96 % (09/14 0529)  CBC:  Recent Labs Lab 02/17/16 1045 02/17/16 1059  WBC 8.5  --   NEUTROABS 6.5  --   HGB 16.1* 16.3*  HCT 47.4* 48.0*  MCV 89.3  --   PLT 205  --     Basic Metabolic Panel:  Recent Labs Lab 02/17/16 1045 02/17/16 1059  NA 142 142  K 3.5 3.4*  CL 108 104  CO2 24  --   GLUCOSE 116* 118*  BUN 12 11  CREATININE 0.57 0.60  CALCIUM 9.7  --     Lipid Panel: No results found for: CHOL, TRIG, HDL, CHOLHDL, VLDL, LDLCALC HgbA1c:  Lab Results  Component Value Date   HGBA1C 5.4 02/18/2016   Urine Drug Screen:    Component Value Date/Time   LABOPIA NONE DETECTED 02/17/2016 1050   COCAINSCRNUR NONE DETECTED 02/17/2016 1050   LABBENZ NONE DETECTED 02/17/2016 1050   AMPHETMU NONE DETECTED 02/17/2016 1050   THCU NONE DETECTED 02/17/2016 1050   LABBARB NONE DETECTED 02/17/2016 1050      IMAGING I have personally reviewed the radiological images below and agree with the radiology interpretations.  Ct Angio neck and Head W Or Wo Contrast 02/18/2016 IMPRESSION: 1. No acute arterial finding. 2. Minimal atherosclerosis in the neck with no stenosis. 3. Moderate right P1/2 segment stenosis. No  proximal left PCA finding to explain the acute infarct. 4. **An incidental finding of potential clinical significance has been found. 35 mm left thyroid nodule. If appropriate for comorbidities, recommend outpatient thyroid sonography.** 5. 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  Dg Chest 2 View 02/17/2016 IMPRESSION: No evidence of acute cardiopulmonary disease.   Mr Brain Wo Contrast 02/17/2016 IMPRESSION: 1. Acute lacunar infarct lateral left thalamus near the posterior limb of the left internal capsule. No associated hemorrhage or mass effect. 2. Patchy chronic appearing signal changes in the cerebral white matter and to a lesser extent deep gray matter nuclei and pons, favor chronic small vessel disease related. 3. 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   CUS - The vertebral arteries appear patent with antegrade flow. - Findings consistent with 1- 39 percent stenosis involving the   right internal carotid artery and the left internal carotid   artery.  TTE - Left ventricle: The cavity size was normal. Wall thickness was   normal. Systolic function was vigorous. The estimated ejection   fraction was in the range of 65% to 70%. Wall motion was normal;   there were no regional wall motion abnormalities. Doppler   parameters are consistent with abnormal left  ventricular   relaxation (grade 1 diastolic dysfunction). Impressions: - No cardiac source of emboli was indentified.  PHYSICAL EXAM  Temp:  [97.8 F (36.6 C)-98.7 F (37.1 C)] 98.3 F (36.8 C) (09/14 0918) Pulse Rate:  [82-96] 88 (09/14 0918) Resp:  [18-20] 20 (09/14 0918) BP: (156-176)/(84-99) 168/84 (09/14 0918) SpO2:  [96 %-100 %] 100 % (09/14 0918)  General - Well nourished, well developed, in no apparent distress.  Ophthalmologic - Sharp disc margins OU.   Cardiovascular - Regular rate and rhythm.  Mental  Status -  Level of arousal and orientation to time, place, and person were intact. Language including expression, naming, repetition, comprehension was assessed and found intact. Attention span and concentration were normal. Recent and remote memory were intact. Fund of Knowledge was assessed and was intact.  Cranial Nerves II - XII - II - Visual field intact OU. III, IV, VI - Extraocular movements intact. V - Facial sensation intact bilaterally. VII - Facial movement intact bilaterally. VIII - Hearing & vestibular intact bilaterally. X - Palate elevates symmetrically. XI - Chin turning & shoulder shrug intact bilaterally XII - Tongue protrusion intact.  Motor Strength - The patient's strength was normal in all extremities and pronator drift was absent.  Bulk was normal and fasciculations were absent.   Motor Tone - Muscle tone was assessed at the neck and appendages and was normal.  Reflexes - The patient's reflexes were 1+ in all extremities and she had no pathological reflexes.  Sensory - Light touch, temperature/pinprick were assessed and were symmetrical.    Coordination - The patient had normal movements in the hands and feet with no ataxia or dysmetria.  Tremor was absent.  Gait and Station - The patient's transfers, posture, gait, station, and turns were observed as normal.   ASSESSMENT/PLAN Vanessa Ford is a 75 y.o. female with history of diverticulosis presenting with perioral, lip and both hands tingling. She did not receive IV t-PA due to out of window.   Stroke:  left thalamic infarct secondary to small vessel disease source  Resultant deficit resolved  MRI  Left thalamic infarct  CTA  Head and neck unremarkable  Carotid Doppler  unremarkable  2D Echo  EF 65-70%  LDL 123  HgbA1c 5.4  lovenox for VTE prophylaxis  Diet regular Room service appropriate? Yes; Fluid consistency: Thin  No antithrombotic prior to admission, now on aspirin 325 mg daily.  Continue ASA On discharge  Patient counseled to be compliant with her antithrombotic medications  Ongoing aggressive stroke risk factor management  Therapy recommendations:  home  Disposition:  home  Hypertension  Stable  Permissive hypertension (OK if < 220/120) but gradually normalize in 5-7 days  Long-term BP goal normotensive  On amlodipine now.  Follow up with PCP closely  Hyperlipidemia  Home meds:  none  LDL 123, goal < 70  Pt refused to initiate statin at this time, she would like to follow up with her PCP in [facility]  Other Stroke Risk Factors  Advanced age  Other Active Problems  An incidental finding of 35 mm left thyroid nodule - recommend outpatient thyroid sonography with PCP  Hospital day # 2  Neurology will sign off. Please call with questions. Pt will follow up with Cecille Rubin NP at [facility] in about 6 weeks. Thanks for the consult.  Rosalin Hawking, MD PhD Stroke Neurology 02/19/2016 11:24 AM     To contact Stroke Continuity provider, please refer to [email or web address] After hours, contact General  Neurology

## 2016-02-27 DIAGNOSIS — Z23 Encounter for immunization: Secondary | ICD-10-CM | POA: Diagnosis not present

## 2016-03-01 ENCOUNTER — Other Ambulatory Visit: Payer: Self-pay | Admitting: Internal Medicine

## 2016-03-01 DIAGNOSIS — E041 Nontoxic single thyroid nodule: Secondary | ICD-10-CM

## 2016-03-10 ENCOUNTER — Other Ambulatory Visit: Payer: Medicare Other

## 2016-03-15 ENCOUNTER — Ambulatory Visit
Admission: RE | Admit: 2016-03-15 | Discharge: 2016-03-15 | Disposition: A | Discharge status: Home / Self Care | Payer: Medicare Other | Source: Ambulatory Visit | Attending: Internal Medicine | Admitting: Internal Medicine

## 2016-03-15 DIAGNOSIS — E041 Nontoxic single thyroid nodule: Secondary | ICD-10-CM

## 2016-03-15 DIAGNOSIS — E042 Nontoxic multinodular goiter: Secondary | ICD-10-CM | POA: Diagnosis not present

## 2016-03-18 ENCOUNTER — Encounter: Payer: Self-pay | Admitting: Nurse Practitioner

## 2016-03-18 ENCOUNTER — Other Ambulatory Visit: Payer: Self-pay | Admitting: Internal Medicine

## 2016-03-18 DIAGNOSIS — E041 Nontoxic single thyroid nodule: Secondary | ICD-10-CM

## 2016-03-19 DIAGNOSIS — N958 Other specified menopausal and perimenopausal disorders: Secondary | ICD-10-CM | POA: Diagnosis not present

## 2016-03-19 DIAGNOSIS — M25569 Pain in unspecified knee: Secondary | ICD-10-CM | POA: Diagnosis not present

## 2016-03-19 DIAGNOSIS — M810 Age-related osteoporosis without current pathological fracture: Secondary | ICD-10-CM | POA: Diagnosis not present

## 2016-03-19 DIAGNOSIS — E041 Nontoxic single thyroid nodule: Secondary | ICD-10-CM | POA: Diagnosis not present

## 2016-03-19 DIAGNOSIS — I639 Cerebral infarction, unspecified: Secondary | ICD-10-CM | POA: Diagnosis not present

## 2016-03-19 DIAGNOSIS — Z6821 Body mass index (BMI) 21.0-21.9, adult: Secondary | ICD-10-CM | POA: Diagnosis not present

## 2016-03-19 DIAGNOSIS — I10 Essential (primary) hypertension: Secondary | ICD-10-CM | POA: Diagnosis not present

## 2016-03-19 DIAGNOSIS — E784 Other hyperlipidemia: Secondary | ICD-10-CM | POA: Diagnosis not present

## 2016-03-30 DIAGNOSIS — H04123 Dry eye syndrome of bilateral lacrimal glands: Secondary | ICD-10-CM | POA: Diagnosis not present

## 2016-03-30 DIAGNOSIS — H26492 Other secondary cataract, left eye: Secondary | ICD-10-CM | POA: Diagnosis not present

## 2016-03-30 DIAGNOSIS — H35363 Drusen (degenerative) of macula, bilateral: Secondary | ICD-10-CM | POA: Diagnosis not present

## 2016-03-30 DIAGNOSIS — I639 Cerebral infarction, unspecified: Secondary | ICD-10-CM | POA: Diagnosis not present

## 2016-03-30 DIAGNOSIS — R7303 Prediabetes: Secondary | ICD-10-CM | POA: Diagnosis not present

## 2016-03-30 DIAGNOSIS — M810 Age-related osteoporosis without current pathological fracture: Secondary | ICD-10-CM | POA: Diagnosis not present

## 2016-03-30 DIAGNOSIS — Z961 Presence of intraocular lens: Secondary | ICD-10-CM | POA: Diagnosis not present

## 2016-03-30 DIAGNOSIS — C4492 Squamous cell carcinoma of skin, unspecified: Secondary | ICD-10-CM | POA: Diagnosis not present

## 2016-03-30 DIAGNOSIS — F439 Reaction to severe stress, unspecified: Secondary | ICD-10-CM | POA: Diagnosis not present

## 2016-03-30 DIAGNOSIS — D126 Benign neoplasm of colon, unspecified: Secondary | ICD-10-CM | POA: Diagnosis not present

## 2016-03-30 DIAGNOSIS — E7800 Pure hypercholesterolemia, unspecified: Secondary | ICD-10-CM | POA: Diagnosis not present

## 2016-03-30 DIAGNOSIS — Z1231 Encounter for screening mammogram for malignant neoplasm of breast: Secondary | ICD-10-CM | POA: Diagnosis not present

## 2016-03-30 DIAGNOSIS — M25361 Other instability, right knee: Secondary | ICD-10-CM | POA: Diagnosis not present

## 2016-03-30 DIAGNOSIS — Z136 Encounter for screening for cardiovascular disorders: Secondary | ICD-10-CM | POA: Diagnosis not present

## 2016-03-30 DIAGNOSIS — Z780 Asymptomatic menopausal state: Secondary | ICD-10-CM | POA: Diagnosis not present

## 2016-03-31 ENCOUNTER — Other Ambulatory Visit (HOSPITAL_COMMUNITY)
Admission: RE | Admit: 2016-03-31 | Discharge: 2016-03-31 | Disposition: A | Discharge status: Home / Self Care | Payer: Medicare Other | Source: Ambulatory Visit | Attending: General Surgery | Admitting: General Surgery

## 2016-03-31 ENCOUNTER — Ambulatory Visit
Admission: RE | Admit: 2016-03-31 | Discharge: 2016-03-31 | Disposition: A | Discharge status: Home / Self Care | Payer: Medicare Other | Source: Ambulatory Visit | Attending: Internal Medicine | Admitting: Internal Medicine

## 2016-03-31 DIAGNOSIS — E041 Nontoxic single thyroid nodule: Secondary | ICD-10-CM | POA: Diagnosis not present

## 2016-04-07 DIAGNOSIS — C4492 Squamous cell carcinoma of skin, unspecified: Secondary | ICD-10-CM | POA: Diagnosis not present

## 2016-04-07 DIAGNOSIS — F439 Reaction to severe stress, unspecified: Secondary | ICD-10-CM | POA: Diagnosis not present

## 2016-04-07 DIAGNOSIS — S83231A Complex tear of medial meniscus, current injury, right knee, initial encounter: Secondary | ICD-10-CM | POA: Diagnosis not present

## 2016-04-07 DIAGNOSIS — D126 Benign neoplasm of colon, unspecified: Secondary | ICD-10-CM | POA: Diagnosis not present

## 2016-04-07 DIAGNOSIS — M1711 Unilateral primary osteoarthritis, right knee: Secondary | ICD-10-CM | POA: Diagnosis not present

## 2016-04-07 DIAGNOSIS — Z136 Encounter for screening for cardiovascular disorders: Secondary | ICD-10-CM | POA: Diagnosis not present

## 2016-04-07 DIAGNOSIS — S72431A Displaced fracture of medial condyle of right femur, initial encounter for closed fracture: Secondary | ICD-10-CM | POA: Diagnosis not present

## 2016-04-07 DIAGNOSIS — R7303 Prediabetes: Secondary | ICD-10-CM | POA: Diagnosis not present

## 2016-04-07 DIAGNOSIS — G8929 Other chronic pain: Secondary | ICD-10-CM | POA: Diagnosis not present

## 2016-04-07 DIAGNOSIS — M25361 Other instability, right knee: Secondary | ICD-10-CM | POA: Diagnosis not present

## 2016-04-07 DIAGNOSIS — Z780 Asymptomatic menopausal state: Secondary | ICD-10-CM | POA: Diagnosis not present

## 2016-04-07 DIAGNOSIS — Y33XXXA Other specified events, undetermined intent, initial encounter: Secondary | ICD-10-CM | POA: Diagnosis not present

## 2016-04-07 DIAGNOSIS — E7800 Pure hypercholesterolemia, unspecified: Secondary | ICD-10-CM | POA: Diagnosis not present

## 2016-04-07 DIAGNOSIS — M810 Age-related osteoporosis without current pathological fracture: Secondary | ICD-10-CM | POA: Diagnosis not present

## 2016-04-07 DIAGNOSIS — M25561 Pain in right knee: Secondary | ICD-10-CM | POA: Diagnosis not present

## 2016-04-07 DIAGNOSIS — I639 Cerebral infarction, unspecified: Secondary | ICD-10-CM | POA: Diagnosis not present

## 2016-04-07 DIAGNOSIS — Z1231 Encounter for screening mammogram for malignant neoplasm of breast: Secondary | ICD-10-CM | POA: Diagnosis not present

## 2016-04-22 ENCOUNTER — Ambulatory Visit (INDEPENDENT_AMBULATORY_CARE_PROVIDER_SITE_OTHER): Payer: Medicare Other | Admitting: Nurse Practitioner

## 2016-04-22 ENCOUNTER — Encounter: Payer: Self-pay | Admitting: Nurse Practitioner

## 2016-04-22 VITALS — BP 156/90 | HR 104 | Ht 69.0 in | Wt 144.2 lb

## 2016-04-22 DIAGNOSIS — I10 Essential (primary) hypertension: Secondary | ICD-10-CM | POA: Diagnosis not present

## 2016-04-22 DIAGNOSIS — I639 Cerebral infarction, unspecified: Secondary | ICD-10-CM

## 2016-04-22 DIAGNOSIS — E785 Hyperlipidemia, unspecified: Secondary | ICD-10-CM | POA: Diagnosis not present

## 2016-04-22 DIAGNOSIS — I6381 Other cerebral infarction due to occlusion or stenosis of small artery: Secondary | ICD-10-CM

## 2016-04-22 NOTE — Progress Notes (Signed)
[facility]  PATIENT: Vanessa Ford DOB: 09/05/1940   REASON FOR VISIT: Hospital follow-up for stroke HISTORY FROM: Patient    HISTORY OF PRESENT ILLNESS:Vanessa Ford a 75 y.o.femalegenerally healthy with history of tubular adenoma of the colon and hyperplastic polyp, diverticulosis, osteoporosis,presented to the [facility] emergency room with chief complaint of numbness around her mouth, as well as tingling in bilateral hands, as well as difficulties with balance and feeling dizzy. She underwent an MRI of the brain which showed an acute lacunar infarct lateral left thalamus near the posterior limb of the left internal capsule. Neurology was consulted and TRH was asked for admission from [facility] emergency department to [facility]. She was on aspirin 325mg   but had not been taking it for a while. She follows with [facility] for her cholesterol and has refused to restart his statin. CTA of the head and neck was unremarkable. Carotid Doppler unremarkable 2-D echo 65-70% EF hemoglobin A1c 5.4. On clinic follow-up today she remains on aspirin 325mg  with minimal bruising and no bleeding. She has followed up with her cholesterol to [name] at [facility]. Repeat LDL on 03/30/2016 was 127 total cholesterol 224. She still refuses a statin. Blood pressure is elevated in the office today at 156/90 she is taking Norvasc. She returns for reevaluation without further stroke or TIA symptoms she still has some mild tingling in her right hand, it has resolved in her lips and left hand.   REVIEW OF SYSTEMS: Full 14 system review of systems performed and notable only for those listed, all others are neg:  Constitutional: neg  Cardiovascular: neg Ear/Nose/Throat: neg  Skin: neg Eyes: neg Respiratory: neg Gastroitestinal: Frequency Hematology/Lymphatic: Easy bruising  Endocrine: neg Musculoskeletal:neg Allergy/Immunology: neg Neurological: neg Psychiatric: neg Sleep :  neg   ALLERGIES: Allergies  Allergen Reactions  . Doxycycline Other (See Comments)    esophagitis  . Other Itching and Rash    Band aids    HOME MEDICATIONS: Outpatient Medications Prior to Visit  Medication Sig Dispense Refill  . amLODipine (NORVASC) 5 MG tablet Take 1 tablet (5 mg total) by mouth daily. 30 tablet 0  . aspirin EC 325 MG EC tablet Take 1 tablet (325 mg total) by mouth daily. 30 tablet 0  . CRANBERRY PO Take 1 tablet by mouth daily.    . Cyanocobalamin (B-12 PO) Take 1 tablet by mouth daily.    Marland Kitchen ECHINACEA PO Take 1 tablet by mouth daily.    Marland Kitchen ibuprofen (ADVIL,MOTRIN) 200 MG tablet Take 400 mg by mouth every 6 (six) hours as needed for headache.    . Multiple Vitamin (MULTIVITAMIN WITH MINERALS) TABS tablet Take 1 tablet by mouth daily.     No facility-administered medications prior to visit.     PAST MEDICAL HISTORY: Past Medical History:  Diagnosis Date  . Diverticulosis   . Fibroid   . Hyperplastic colon polyp 2004  . Osteoporosis   . PONV (postoperative nausea and vomiting)   . Tubular adenoma of colon 2004    PAST SURGICAL HISTORY: Past Surgical History:  Procedure Laterality Date  . FACIAL COSMETIC SURGERY    . NASAL SEPTUM SURGERY    . SQUAMOUS CELL CARCINOMA EXCISION     face, hand  . UTERINE FIBROID SURGERY      FAMILY HISTORY: Family History  Problem Relation Age of Onset  . Colon cancer Mother   . Cirrhosis Father   . Stroke Paternal Grandmother   . Heart attack  Maternal Grandmother     SOCIAL HISTORY: Social History   Social History  . Marital status: Married    Spouse name: N/A  . Number of children: 2  . Years of education: N/A   Occupational History  . retired Insurance underwriter   Social History Main Topics  . Smoking status: Former Smoker    Types: Cigarettes    Quit date: 06/08/1979  . Smokeless tobacco: Never Used  . Alcohol use Yes     Comment: 2 drinks on the weekend  . Drug use: No  . Sexual activity: No    Other Topics Concern  . Not on file   Social History Narrative  . No narrative on file     PHYSICAL EXAM  Vitals:   04/22/16 1536  BP: (!) 156/90  Pulse: (!) 104  Weight: 144 lb 3.2 oz (65.4 kg)  Height: 5\' 9"  (1.753 m)   Body mass index is 21.29 kg/m.  Generalized: Well developed, in no acute distress  Head: normocephalic and atraumatic,. Oropharynx benign  Neck: Supple, no carotid bruits  Cardiac: Regular rate rhythm, no murmur  Musculoskeletal: No deformity   Neurological examination   Mentation: Alert oriented to time, place, history taking. Attention span and concentration appropriate. Recent and remote memory intact.  Follows all commands speech and language fluent.   Cranial nerve II-XII: Fundoscopic exam reveals sharp disc margins.Pupils were equal round reactive to light extraocular movements were full, visual field were full on confrontational test. Facial sensation and strength were normal. hearing was intact to finger rubbing bilaterally. Uvula tongue midline. head turning and shoulder shrug were normal and symmetric.Tongue protrusion into cheek strength was normal. Motor: normal bulk and tone, full strength in the BUE, BLE, fine finger movements normal, no pronator drift. No focal weakness Sensory: normal and symmetric to light touch, pinprick, and  Vibration, In the upper and lower extremities Coordination: finger-nose-finger, heel-to-shin bilaterally, no dysmetria Reflexes: 1+ upper lower and symmetric, plantar responses were flexor bilaterally. Gait and Station: Rising up from seated position without assistance, normal stance,  moderate stride, good arm swing, smooth turning, able to perform tiptoe, and heel walking without difficulty. Tandem gait is steady  DIAGNOSTIC DATA (LABS, IMAGING, TESTING) - I reviewed patient records, labs, notes, testing and imaging myself where available.  Lab Results  Component Value Date   WBC 7.2 02/19/2016   HGB 16.0 (H)  02/19/2016   HCT 49.3 (H) 02/19/2016   MCV 91.0 02/19/2016   PLT 216 02/19/2016      Component Value Date/Time   NA 140 02/19/2016 0757   K 3.9 02/19/2016 0757   CL 105 02/19/2016 0757   CO2 28 02/19/2016 0757   GLUCOSE 113 (H) 02/19/2016 0757   BUN 9 02/19/2016 0757   CREATININE 0.61 02/19/2016 0757   CALCIUM 9.8 02/19/2016 0757   PROT 7.3 02/17/2016 1045   ALBUMIN 4.4 02/17/2016 1045   AST 24 02/17/2016 1045   ALT 20 02/17/2016 1045   ALKPHOS 62 02/17/2016 1045   BILITOT 1.1 02/17/2016 1045   GFRNONAA >60 02/19/2016 0757   GFRAA >60 02/19/2016 0757   Lab Results  Component Value Date   CHOL 219 (H) 02/19/2016   HDL 84 02/19/2016   LDLCALC 123 (H) 02/19/2016   TRIG 59 02/19/2016   CHOLHDL 2.6 02/19/2016   Lab Results  Component Value Date   HGBA1C 5.4 02/18/2016   Lab Results  Component Value Date   VITAMINB12 1,064 (H) 02/19/2016   Lab Results  Component Value Date   TSH 2.453 02/19/2016      ASSESSMENT AND PLAN  75 y.o. year old female with recent hospital admission for left thalamic infarct secondary to small vessel disease, with risk factors of hypertension hyperlipidemia and age.The patient is a current patient of Dr.Xu  who is out of the office today . This note is sent to the work in doctor.     PLANStressed the importance of management of risk factors to prevent further stroke Continue aspirinfor secondary stroke prevention Maintain strict control of hypertension with blood pressure goal below 130/90, today's reading 156/90 continue antihypertensive medications  hemoglobin A1c below 6.5 followed by primary care most recent hemoglobin A1c 5.4 Cholesterol with LDL cholesterol less than 70, followed by primary care,  most recent 127  Exercise by walking, slowly increase , eat healthy diet with whole grains,  fresh fruits and vegetables Discussed risk for recurrent stroke/ TIA and answered additional questions Follow-up in 4 months.Vst time 30 min Dennie Bible, [facility], [facility], APRN  [facility] [address], [place], [place] [address] [phone]

## 2016-04-22 NOTE — Patient Instructions (Addendum)
Stressed the importance of management of risk factors to prevent further stroke Continue aspirinfor secondary stroke prevention Maintain strict control of hypertension with blood pressure goal below 130/90, today's reading 156/90 continue antihypertensive medications  hemoglobin A1c below 6.5 followed by primary care most recent hemoglobin A1c 5.4 Cholesterol with LDL cholesterol less than 70, followed by primary care,  most recent 127  Exercise by walking, slowly increase , eat healthy diet with whole grains,  fresh fruits and vegetables Discussed risk for recurrent stroke/ TIA and answered additional questions Follow-up in 4 months

## 2016-05-01 NOTE — Progress Notes (Signed)
Personally  participated in and made any corrections needed to history, physical, neuro exam,assessment and plan as stated above.  I have personally obtained the history, evaluated lab date, reviewed imaging studies and agree with radiology interpretations.    Dorissa Stinnette, MD  

## 2016-05-26 DIAGNOSIS — S8001XA Contusion of right knee, initial encounter: Secondary | ICD-10-CM | POA: Diagnosis not present

## 2016-07-08 DIAGNOSIS — Z85828 Personal history of other malignant neoplasm of skin: Secondary | ICD-10-CM | POA: Diagnosis not present

## 2016-07-08 DIAGNOSIS — L858 Other specified epidermal thickening: Secondary | ICD-10-CM | POA: Diagnosis not present

## 2016-07-08 DIAGNOSIS — L570 Actinic keratosis: Secondary | ICD-10-CM | POA: Diagnosis not present

## 2016-07-08 DIAGNOSIS — C44729 Squamous cell carcinoma of skin of left lower limb, including hip: Secondary | ICD-10-CM | POA: Diagnosis not present

## 2016-07-08 DIAGNOSIS — L821 Other seborrheic keratosis: Secondary | ICD-10-CM | POA: Diagnosis not present

## 2016-07-08 DIAGNOSIS — D485 Neoplasm of uncertain behavior of skin: Secondary | ICD-10-CM | POA: Diagnosis not present

## 2016-08-23 ENCOUNTER — Ambulatory Visit (INDEPENDENT_AMBULATORY_CARE_PROVIDER_SITE_OTHER): Payer: Medicare Other | Admitting: Nurse Practitioner

## 2016-08-23 ENCOUNTER — Encounter: Payer: Self-pay | Admitting: Nurse Practitioner

## 2016-08-23 VITALS — BP 130/82 | HR 84 | Ht 69.0 in | Wt 146.0 lb

## 2016-08-23 DIAGNOSIS — I6381 Other cerebral infarction due to occlusion or stenosis of small artery: Secondary | ICD-10-CM

## 2016-08-23 DIAGNOSIS — I10 Essential (primary) hypertension: Secondary | ICD-10-CM

## 2016-08-23 DIAGNOSIS — E785 Hyperlipidemia, unspecified: Secondary | ICD-10-CM

## 2016-08-23 DIAGNOSIS — I639 Cerebral infarction, unspecified: Secondary | ICD-10-CM

## 2016-08-23 NOTE — Progress Notes (Signed)
[facility]  PATIENT: Vanessa Ford DOB: 1941-04-12   REASON FOR VISIT:  follow-up for lacunar infarct left thalamus  HISTORY FROM: Patient    HISTORY OF PRESENT ILLNESS:UPDATE 03/19/2018CM  Ms. 9, 76 year old female returns for for follow-up for acute lacunar infarct left thalamus in September 2017. She is currently on aspirin for secondary stroke prevention without further stroke or TIA symptoms. She has no bruising and no bleeding. She sees [name] at [facility] who changed her Norvasc to Ramipril , with better control of her blood pressure. Today's reading 130/82. She refuses to be on a statin medication she continues to exercise every day , she has no new neurologic complaints HISTORY 04/22/16 CMAnne P Ford a 76 y.o.femalegenerally healthy with history of tubular adenoma of the colon and hyperplastic polyp, diverticulosis, osteoporosis,presented to the [facility] emergency room with chief complaint of numbness around her mouth, as well as tingling in bilateral hands, as well as difficulties with balance and feeling dizzy. She underwent an MRI of the brain which showed an acute lacunar infarct lateral left thalamus near the posterior limb of the left internal capsule. Neurology was consulted and TRH was asked for admission from [facility] emergency department to [facility]. She was on aspirin 325mg   but had not been taking it for a while. She follows with [facility] for her cholesterol and has refused to restart his statin. CTA of the head and neck was unremarkable. Carotid Doppler unremarkable 2-D echo 65-70% EF hemoglobin A1c 5.4. On clinic follow-up today she remains on aspirin 325mg  with minimal bruising and no bleeding. She has followed up with her cholesterol to [name] at [facility]. Repeat LDL on 03/30/2016 was 127 total cholesterol 224. She still refuses a statin. Blood pressure is elevated in the office today at 156/90 she is taking Norvasc. She returns for reevaluation  without further stroke or TIA symptoms she still has some mild tingling in her right hand, it has resolved in her lips and left hand.   REVIEW OF SYSTEMS: Full 14 system review of systems performed and notable only for those listed, all others are neg:  Constitutional: neg  Cardiovascular: neg Ear/Nose/Throat: neg  Skin: neg Eyes: neg Respiratory: neg Gastroitestinal: Frequency Hematology/Lymphatic: neg Endocrine: neg Musculoskeletal:neg Allergy/Immunology: Environmental allergies Neurological: neg Psychiatric: neg Sleep : neg   ALLERGIES: Allergies  Allergen Reactions  . Doxycycline Other (See Comments)    esophagitis  . Other Itching and Rash    Band aids    HOME MEDICATIONS: Outpatient Medications Prior to Visit  Medication Sig Dispense Refill  . aspirin EC 325 MG EC tablet Take 1 tablet (325 mg total) by mouth daily. 30 tablet 0  . CRANBERRY PO Take 1 tablet by mouth daily.    . Cyanocobalamin (B-12 PO) Take 1 tablet by mouth daily.    Marland Kitchen ECHINACEA PO Take 1 tablet by mouth daily.    Marland Kitchen ibuprofen (ADVIL,MOTRIN) 200 MG tablet Take 400 mg by mouth every 6 (six) hours as needed for headache.    . Multiple Vitamin (MULTIVITAMIN WITH MINERALS) TABS tablet Take 1 tablet by mouth daily.    Marland Kitchen amLODipine (NORVASC) 5 MG tablet Take 1 tablet (5 mg total) by mouth daily. (Patient not taking: Reported on 08/23/2016) 30 tablet 0   No facility-administered medications prior to visit.     PAST MEDICAL HISTORY: Past Medical History:  Diagnosis Date  . Diverticulosis   . Fibroid   . Hyperplastic colon polyp 2004  .  Osteoporosis   . PONV (postoperative nausea and vomiting)   . Tubular adenoma of colon 2004    PAST SURGICAL HISTORY: Past Surgical History:  Procedure Laterality Date  . FACIAL COSMETIC SURGERY    . NASAL SEPTUM SURGERY    . SQUAMOUS CELL CARCINOMA EXCISION     face, hand  . UTERINE FIBROID SURGERY      FAMILY HISTORY: Family History  Problem Relation Age  of Onset  . Colon cancer Mother   . Cirrhosis Father   . Stroke Paternal Grandmother   . Heart attack Maternal Grandmother     SOCIAL HISTORY: Social History   Social History  . Marital status: Married    Spouse name: N/A  . Number of children: 2  . Years of education: N/A   Occupational History  . retired Insurance underwriter   Social History Main Topics  . Smoking status: Former Smoker    Types: Cigarettes    Quit date: 06/08/1979  . Smokeless tobacco: Never Used  . Alcohol use Yes     Comment: 2 drinks on the weekend  . Drug use: No  . Sexual activity: No   Other Topics Concern  . Not on file   Social History Narrative  . No narrative on file     PHYSICAL EXAM  Vitals:   08/23/16 1340  BP: (!)130/82   Pulse: 84  Weight: 146 lb (66.2 kg)  Height: 5\' 9"  (1.753 m)   Body mass index is 21.56 kg/m.  Generalized: Well developed, in no acute distress  Head: normocephalic and atraumatic,. Oropharynx benign  Neck: Supple, no carotid bruits  Cardiac: Regular rate rhythm, no murmur  Musculoskeletal: No deformity   Neurological examination   Mentation: Alert oriented to time, place, history taking. Attention span and concentration appropriate. Recent and remote memory intact.  Follows all commands speech and language fluent.   Cranial nerve II-XII: Pupils were equal round reactive to light extraocular movements were full, visual field were full on confrontational test. Facial sensation and strength were normal. hearing was intact to finger rubbing bilaterally. Uvula tongue midline. head turning and shoulder shrug were normal and symmetric.Tongue protrusion into cheek strength was normal. Motor: normal bulk and tone, full strength in the BUE, BLE, fine finger movements normal, no pronator drift. No focal weakness Sensory: normal and symmetric to light touch, pinprick, and  Vibration, In the upper and lower extremities Coordination: finger-nose-finger, heel-to-shin  bilaterally, no dysmetria Reflexes: 1+ upper lower and symmetric, plantar responses were flexor bilaterally. Gait and Station: Rising up from seated position without assistance, normal stance,  moderate stride, good arm swing, smooth turning, able to perform tiptoe, and heel walking without difficulty. Tandem gait is steady. No assistive device  DIAGNOSTIC DATA (LABS, IMAGING, TESTING) - I reviewed patient records, labs, notes, testing and imaging myself where available.  Lab Results  Component Value Date   WBC 7.2 02/19/2016   HGB 16.0 (H) 02/19/2016   HCT 49.3 (H) 02/19/2016   MCV 91.0 02/19/2016   PLT 216 02/19/2016      Component Value Date/Time   NA 140 02/19/2016 0757   K 3.9 02/19/2016 0757   CL 105 02/19/2016 0757   CO2 28 02/19/2016 0757   GLUCOSE 113 (H) 02/19/2016 0757   BUN 9 02/19/2016 0757   CREATININE 0.61 02/19/2016 0757   CALCIUM 9.8 02/19/2016 0757   PROT 7.3 02/17/2016 1045   ALBUMIN 4.4 02/17/2016 1045   AST 24 02/17/2016 1045   ALT 20  02/17/2016 1045   ALKPHOS 62 02/17/2016 1045   BILITOT 1.1 02/17/2016 1045   GFRNONAA >60 02/19/2016 0757   GFRAA >60 02/19/2016 0757   Lab Results  Component Value Date   CHOL 219 (H) 02/19/2016   HDL 84 02/19/2016   LDLCALC 123 (H) 02/19/2016   TRIG 59 02/19/2016   CHOLHDL 2.6 02/19/2016   Lab Results  Component Value Date   HGBA1C 5.4 02/18/2016   Lab Results  Component Value Date   VITAMINB12 1,064 (H) 02/19/2016   Lab Results  Component Value Date   TSH 2.453 02/19/2016      ASSESSMENT AND PLAN  76 y.o. year old female with follow up for  left thalamic infarct secondary to small vessel disease, with risk factors of hypertension hyperlipidemia and age.     PLAN Stressed the importance of continued management of risk factors to prevent further stroke Continue aspirin for secondary stroke prevention Maintain strict control of hypertension with blood pressure goal below 130/90, today's reading 130/82  continue antihypertensive medications  Exercise by walking, slowly increase , eat healthy diet with whole grains,  fresh fruits and vegetables Cholesterol with LDL cholesterol less than 70, followed by primary care,  most recent 127 pt refuses to take statin  Follow up 6 months, if stable will discharge A total of 15  minutes was spent face-to-face with this patient. Over half this time was spent on counseling patient on the stroke diagnosis and management of risk factors. I'm concerned that she is not on a statin drug however she refuses . Dennie Bible, [facility], [place], APRN  [facility] [address] [place], [place] [address] [phone]

## 2016-08-23 NOTE — Patient Instructions (Signed)
Stressed the importance of management of risk factors to prevent further stroke Continue aspirin for secondary stroke prevention Maintain strict control of hypertension with blood pressure goal below 130/90, today's reading 130/82 continue antihypertensive medications  Exercise by walking, slowly increase , eat healthy diet with whole grains,  fresh fruits and vegetables Follow up 6 months

## 2016-08-23 NOTE — Progress Notes (Signed)
I reviewed above note and agree with the assessment and plan.  Rosalin Hawking, MD PhD Stroke Neurology 08/23/2016 5:56 PM

## 2016-11-15 DIAGNOSIS — Z85828 Personal history of other malignant neoplasm of skin: Secondary | ICD-10-CM | POA: Diagnosis not present

## 2016-11-15 DIAGNOSIS — L814 Other melanin hyperpigmentation: Secondary | ICD-10-CM | POA: Diagnosis not present

## 2016-11-15 DIAGNOSIS — D485 Neoplasm of uncertain behavior of skin: Secondary | ICD-10-CM | POA: Diagnosis not present

## 2016-11-15 DIAGNOSIS — D1801 Hemangioma of skin and subcutaneous tissue: Secondary | ICD-10-CM | POA: Diagnosis not present

## 2016-11-15 DIAGNOSIS — L821 Other seborrheic keratosis: Secondary | ICD-10-CM | POA: Diagnosis not present

## 2016-11-15 DIAGNOSIS — L812 Freckles: Secondary | ICD-10-CM | POA: Diagnosis not present

## 2016-12-13 DIAGNOSIS — I10 Essential (primary) hypertension: Secondary | ICD-10-CM | POA: Diagnosis not present

## 2017-02-22 ENCOUNTER — Encounter: Payer: Self-pay | Admitting: Neurology

## 2017-02-22 ENCOUNTER — Ambulatory Visit (INDEPENDENT_AMBULATORY_CARE_PROVIDER_SITE_OTHER): Payer: Medicare Other | Admitting: Neurology

## 2017-02-22 VITALS — BP 171/96 | HR 90 | Wt 142.2 lb

## 2017-02-22 DIAGNOSIS — I10 Essential (primary) hypertension: Secondary | ICD-10-CM | POA: Diagnosis not present

## 2017-02-22 DIAGNOSIS — E785 Hyperlipidemia, unspecified: Secondary | ICD-10-CM | POA: Diagnosis not present

## 2017-02-22 DIAGNOSIS — I63332 Cerebral infarction due to thrombosis of left posterior cerebral artery: Secondary | ICD-10-CM | POA: Insufficient documentation

## 2017-02-22 NOTE — Progress Notes (Signed)
STROKE NEUROLOGY FOLLOW UP NOTE  NAME: Vanessa Ford DOB: 05-Jun-1941  REASON FOR VISIT: stroke follow up HISTORY FROM: pt and chart  Today we had the pleasure of seeing Vanessa Ford in follow-up at our Neurology Clinic. Pt was accompanied by no one.   History Summary Vanessa Ford is a 76 y.o. female with history of diverticulosis admitted on 02/17/16 for perioral, lip and both hands tingling. MRI showed left thalamic infarct, CTA head and neck, CUS, TTE unremarkable. LDL 123 and A1C 5.4. She was started with ASA on discharge. BP high put on amlodipine. Pt refused statin and would like follow up with her PCP in [place].  04/22/16 follow up (CM) - Vanessa Ford a 76 y.o.femalegenerally healthy with history of tubular adenoma of the colon and hyperplastic polyp, diverticulosis, osteoporosis,presented[facility] the [facility] room with chief complaint of numbness around her mouth, as well as tingling in bilateral hands, as well as difficulties with balance and feeling dizzy. She underwent an MRI of the brain which showed an acute lacunar infarct lateral left thalamus near the posterior limb of the left internal capsule. Neurology was consulted and TRH was asked for admission from [facility] emergency department to [facility]. She was on aspirin 325mg   but had not been taking it for a while. She follows with [facility] for her cholesterol and has refused to restart his statin. CTA of the head and neck was unremarkable. Carotid Doppler unremarkable 2-D echo 65-70% EF hemoglobin A1c 5.4. On clinic follow-up today she remains on aspirin 325mg  with minimal bruising and no bleeding. She has followed up with her cholesterol to [name] at [facility]. Repeat LDL on 03/30/2016 was 127 total cholesterol 224. She still refuses a statin. Blood pressure is elevated in the office today at 156/90 she is taking Norvasc. She returns for reevaluation without further stroke or TIA symptoms she still has some mild tingling in  her right hand, it has resolved in her lips and left hand.  08/23/16 follow up (CM) - Ms. 76, 76 year old female returns for for follow-up for acute lacunar infarct left thalamus in September 2017. She is currently on aspirin for secondary stroke prevention without further stroke or TIA symptoms. She has no bruising and no bleeding. She sees [name] at [facility] who changed her Norvasc to Ramipril , with better control of her blood pressure. Today's reading 130/82. She refuses to be on a statin medication she continues to exercise every day , she has no new neurologic complaints  Interval History During the interval time, the patient has been doing well. No stroke like symptoms. She can not tolerate amlodipine due to nocturia, or ramipril due to dry cough. She is having acupuncture for BP control. Today BP 171/96 but she said at home her BP was 139/70s. She did not take statin as her PCP in [place] does not think she needs it as her HDL was high. She is doing exercise and sing regularly. She has no complains. Denies s/s of OSA. Thyroid nodule biopsy was benign.   REVIEW OF SYSTEMS: Full 14 system review of systems performed and notable only for those listed below and in HPI above, all others are negative:  Constitutional:   Cardiovascular:  Ear/Nose/Throat:   Skin:  Eyes:   Respiratory:   Gastroitestinal:   Genitourinary:  Hematology/Lymphatic:   Endocrine:  Musculoskeletal:   Allergy/Immunology:   Neurological:   Psychiatric:  Sleep:   The following represents the patient's updated  allergies and side effects list: Allergies  Allergen Reactions  . Doxycycline Other (See Comments)    esophagitis  . Lisinopril Cough  . Other Itching and Rash    Band aids    The neurologically relevant items on the patient's problem list were reviewed on today's visit.  Neurologic Examination  A problem focused neurological exam (12 or more points of the single system neurologic examination, vital  signs counts as 1 point, cranial nerves count for 8 points) was performed.  Blood pressure (!) 171/96, pulse 90, weight 142 lb 3.2 oz (64.5 kg).  General - Well nourished, well developed, in no apparent distress.  Ophthalmologic - Sharp disc margins OU.   Cardiovascular - Regular rate and rhythm with no murmur.  Mental Status -  Level of arousal and orientation to time, place, and person were intact. Language including expression, naming, repetition, comprehension was assessed and found intact. Attention span and concentration were normal. Fund of Knowledge was assessed and was intact.  Cranial Nerves II - XII - II - Visual field intact OU. III, IV, VI - Extraocular movements intact. V - Facial sensation intact bilaterally. VII - Facial movement intact bilaterally. VIII - Hearing & vestibular intact bilaterally. X - Palate elevates symmetrically. XI - Chin turning & shoulder shrug intact bilaterally. XII - Tongue protrusion intact.  Motor Strength - The patient's strength was normal in all extremities and pronator drift was absent.  Bulk was normal and fasciculations were absent.   Motor Tone - Muscle tone was assessed at the neck and appendages and was normal.  Reflexes - The patient's reflexes were 1+ in all extremities and she had no pathological reflexes.  Sensory - Light touch, temperature/pinprick, vibration and proprioception, and Romberg testing were assessed and were normal.    Coordination - The patient had normal movements in the hands and feet with no ataxia or dysmetria.  Tremor was absent.  Gait and Station - The patient's transfers, posture, gait, station, and turns were observed as normal.   Data reviewed: I personally reviewed the images and agree with the radiology interpretations.  Ct Angio neck and Head W Or Wo Contrast 02/18/2016 IMPRESSION: 1. No acute arterial finding. 2. Minimal atherosclerosis in the neck with no stenosis. 3. Moderate right P1/2  segment stenosis. No proximal left PCA finding to explain the acute infarct. 4. **An incidental finding of potential clinical significance has been found. 35 mm left thyroid nodule. If appropriate for comorbidities, recommend outpatient thyroid sonography.** 5. 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  Dg Chest 2 View 02/17/2016 IMPRESSION: No evidence of acute cardiopulmonary disease.   Mr Brain Wo Contrast 02/17/2016 IMPRESSION: 1. Acute lacunar infarct lateral left thalamus near the posterior limb of the left internal capsule. No associated hemorrhage or mass effect. 2. Patchy chronic appearing signal changes in the cerebral white matter and to a lesser extent deep gray matter nuclei and pons, favor chronic small vessel disease related. 3. 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   CUS - The vertebral arteries appear patent with antegrade flow. - Findings consistent with 1- 39 percent stenosis involving the right internal carotid artery and the left internal carotid artery.  TTE - Left ventricle: The cavity size was normal. Wall thickness was normal. Systolic function was vigorous. The estimated ejection fraction was in the range of 65% to 70%. Wall motion was normal; there were no  regional wall motion abnormalities. Doppler parameters are consistent with abnormal left ventricular relaxation (grade 1 diastolic dysfunction). Impressions: - No cardiac source of emboli was indentified.  Component     Latest Ref Rng & Units 02/18/2016 02/19/2016  Cholesterol     0 - 200 mg/dL  219 (H)  Triglycerides     <150 mg/dL  59  HDL Cholesterol     >40 mg/dL  84  Total CHOL/HDL Ratio     RATIO  2.6  VLDL     0 - 40 mg/dL  12  LDL (calc)     0 - 99 mg/dL  123 (H)  Hemoglobin A1C     4.8 - 5.6 % 5.4   Mean Plasma Glucose     mg/dL 108   Vitamin B12     180 - 914  pg/mL  1,064 (H)  TSH     0.350 - 4.500 uIU/mL  2.453    Assessment: As you may recall, she is a 76 y.o. Caucasian female with PMH of diverticulosis admitted on 02/17/16 for perioral, lip and both hands tingling. MRI showed left thalamic infarct, CTA head and neck, CUS, TTE unremarkable. LDL 123 and A1C 5.4. She was started with ASA on discharge. BP high put on amlodipine. She can not tolerate amlodipine due to nocturia, or ramipril due to dry cough. She is having acupuncture for BP control. She did not take statin as her HDL was high according to her PCP in [place]. Denies s/s of OSA. Thyroid nodule biopsy was benign.   Plan:  - continue coated ASA 325mg  for stroke prevention - follow up with doctor in [place] for stroke risk factor modification. Recommend maintain blood pressure goal <140/80, diabetes with hemoglobin A1c goal below 7.0% and lipids with LDL cholesterol goal below 70 mg/dL.  - check BP at home and record - healthy diet and regular exercise  - follow up as needed.  No orders of the defined types were placed in this encounter.   No orders of the defined types were placed in this encounter.   Patient Instructions  - continue coated ASA 325mg  for stroke prevention - follow up with doctor in [place] for stroke risk factor modification. Recommend maintain blood pressure goal <140/80, diabetes with hemoglobin A1c goal below 7.0% and lipids with LDL cholesterol goal below 70 mg/dL.  - check BP at home and record - healthy diet and regular exercise  - follow up as needed.    Rosalin Hawking, MD PhD [facility] [address] [place], [place] [address] [phone]

## 2017-02-22 NOTE — Patient Instructions (Signed)
-   continue coated ASA 325mg  for stroke prevention - follow up with doctor in [place] for stroke risk factor modification. Recommend maintain blood pressure goal <140/80, diabetes with hemoglobin A1c goal below 7.0% and lipids with LDL cholesterol goal below 70 mg/dL.  - check BP at home and record - healthy diet and regular exercise  - follow up as needed.

## 2017-02-28 ENCOUNTER — Ambulatory Visit: Payer: Medicare Other | Admitting: Nurse Practitioner

## 2017-04-27 DIAGNOSIS — R94120 Abnormal auditory function study: Secondary | ICD-10-CM | POA: Diagnosis not present

## 2017-04-27 DIAGNOSIS — Z780 Asymptomatic menopausal state: Secondary | ICD-10-CM | POA: Diagnosis not present

## 2017-04-27 DIAGNOSIS — I639 Cerebral infarction, unspecified: Secondary | ICD-10-CM | POA: Diagnosis not present

## 2017-04-27 DIAGNOSIS — C4492 Squamous cell carcinoma of skin, unspecified: Secondary | ICD-10-CM | POA: Diagnosis not present

## 2017-04-27 DIAGNOSIS — E7800 Pure hypercholesterolemia, unspecified: Secondary | ICD-10-CM | POA: Diagnosis not present

## 2017-04-27 DIAGNOSIS — E041 Nontoxic single thyroid nodule: Secondary | ICD-10-CM | POA: Diagnosis not present

## 2017-04-27 DIAGNOSIS — Z1231 Encounter for screening mammogram for malignant neoplasm of breast: Secondary | ICD-10-CM | POA: Diagnosis not present

## 2017-04-27 DIAGNOSIS — I10 Essential (primary) hypertension: Secondary | ICD-10-CM | POA: Diagnosis not present

## 2017-04-27 DIAGNOSIS — Q279 Congenital malformation of peripheral vascular system, unspecified: Secondary | ICD-10-CM | POA: Diagnosis not present

## 2017-04-27 DIAGNOSIS — R7303 Prediabetes: Secondary | ICD-10-CM | POA: Diagnosis not present

## 2017-04-27 DIAGNOSIS — R922 Inconclusive mammogram: Secondary | ICD-10-CM | POA: Diagnosis not present

## 2017-04-27 DIAGNOSIS — F439 Reaction to severe stress, unspecified: Secondary | ICD-10-CM | POA: Diagnosis not present

## 2017-04-27 DIAGNOSIS — Z136 Encounter for screening for cardiovascular disorders: Secondary | ICD-10-CM | POA: Diagnosis not present

## 2017-04-27 DIAGNOSIS — M8000XA Age-related osteoporosis with current pathological fracture, unspecified site, initial encounter for fracture: Secondary | ICD-10-CM | POA: Diagnosis not present

## 2017-04-27 DIAGNOSIS — D126 Benign neoplasm of colon, unspecified: Secondary | ICD-10-CM | POA: Diagnosis not present

## 2017-06-20 DIAGNOSIS — H547 Unspecified visual loss: Secondary | ICD-10-CM | POA: Diagnosis not present

## 2017-06-20 DIAGNOSIS — H53452 Other localized visual field defect, left eye: Secondary | ICD-10-CM | POA: Diagnosis not present

## 2017-06-23 DIAGNOSIS — H903 Sensorineural hearing loss, bilateral: Secondary | ICD-10-CM | POA: Diagnosis not present

## 2017-07-05 DIAGNOSIS — H53452 Other localized visual field defect, left eye: Secondary | ICD-10-CM | POA: Diagnosis not present

## 2017-07-05 DIAGNOSIS — I6621 Occlusion and stenosis of right posterior cerebral artery: Secondary | ICD-10-CM | POA: Diagnosis not present

## 2017-07-05 DIAGNOSIS — Q279 Congenital malformation of peripheral vascular system, unspecified: Secondary | ICD-10-CM | POA: Diagnosis not present

## 2017-07-05 DIAGNOSIS — H547 Unspecified visual loss: Secondary | ICD-10-CM | POA: Diagnosis not present

## 2017-08-03 DIAGNOSIS — Q279 Congenital malformation of peripheral vascular system, unspecified: Secondary | ICD-10-CM | POA: Diagnosis not present

## 2017-08-23 DIAGNOSIS — H401131 Primary open-angle glaucoma, bilateral, mild stage: Secondary | ICD-10-CM | POA: Diagnosis not present

## 2017-08-25 DIAGNOSIS — M810 Age-related osteoporosis without current pathological fracture: Secondary | ICD-10-CM | POA: Diagnosis not present

## 2017-08-25 DIAGNOSIS — I639 Cerebral infarction, unspecified: Secondary | ICD-10-CM | POA: Diagnosis not present

## 2017-08-25 DIAGNOSIS — E782 Mixed hyperlipidemia: Secondary | ICD-10-CM | POA: Diagnosis not present

## 2017-08-25 DIAGNOSIS — N390 Urinary tract infection, site not specified: Secondary | ICD-10-CM | POA: Diagnosis not present

## 2017-08-25 DIAGNOSIS — I10 Essential (primary) hypertension: Secondary | ICD-10-CM | POA: Diagnosis not present

## 2017-08-25 DIAGNOSIS — G43909 Migraine, unspecified, not intractable, without status migrainosus: Secondary | ICD-10-CM | POA: Diagnosis not present

## 2017-08-26 DIAGNOSIS — I999 Unspecified disorder of circulatory system: Secondary | ICD-10-CM | POA: Diagnosis not present

## 2017-08-26 DIAGNOSIS — I679 Cerebrovascular disease, unspecified: Secondary | ICD-10-CM | POA: Diagnosis not present

## 2017-09-05 DIAGNOSIS — I639 Cerebral infarction, unspecified: Secondary | ICD-10-CM | POA: Diagnosis not present

## 2017-09-05 DIAGNOSIS — R269 Unspecified abnormalities of gait and mobility: Secondary | ICD-10-CM | POA: Diagnosis not present

## 2017-09-05 DIAGNOSIS — I69398 Other sequelae of cerebral infarction: Secondary | ICD-10-CM | POA: Diagnosis not present

## 2017-09-16 DIAGNOSIS — Z6822 Body mass index (BMI) 22.0-22.9, adult: Secondary | ICD-10-CM | POA: Diagnosis not present

## 2017-09-16 DIAGNOSIS — I639 Cerebral infarction, unspecified: Secondary | ICD-10-CM | POA: Diagnosis not present

## 2017-09-16 DIAGNOSIS — I10 Essential (primary) hypertension: Secondary | ICD-10-CM | POA: Diagnosis not present

## 2017-09-16 DIAGNOSIS — E782 Mixed hyperlipidemia: Secondary | ICD-10-CM | POA: Diagnosis not present

## 2017-09-28 DIAGNOSIS — H26492 Other secondary cataract, left eye: Secondary | ICD-10-CM | POA: Diagnosis not present

## 2017-10-11 DIAGNOSIS — H401131 Primary open-angle glaucoma, bilateral, mild stage: Secondary | ICD-10-CM | POA: Diagnosis not present

## 2017-11-17 DIAGNOSIS — H401131 Primary open-angle glaucoma, bilateral, mild stage: Secondary | ICD-10-CM | POA: Diagnosis not present

## 2017-12-12 DIAGNOSIS — L821 Other seborrheic keratosis: Secondary | ICD-10-CM | POA: Diagnosis not present

## 2017-12-12 DIAGNOSIS — L814 Other melanin hyperpigmentation: Secondary | ICD-10-CM | POA: Diagnosis not present

## 2017-12-12 DIAGNOSIS — L720 Epidermal cyst: Secondary | ICD-10-CM | POA: Diagnosis not present

## 2017-12-12 DIAGNOSIS — D485 Neoplasm of uncertain behavior of skin: Secondary | ICD-10-CM | POA: Diagnosis not present

## 2017-12-12 DIAGNOSIS — Z85828 Personal history of other malignant neoplasm of skin: Secondary | ICD-10-CM | POA: Diagnosis not present

## 2017-12-14 DIAGNOSIS — H0288A Meibomian gland dysfunction right eye, upper and lower eyelids: Secondary | ICD-10-CM | POA: Diagnosis not present

## 2017-12-14 DIAGNOSIS — H0288B Meibomian gland dysfunction left eye, upper and lower eyelids: Secondary | ICD-10-CM | POA: Diagnosis not present

## 2018-03-07 DIAGNOSIS — I69398 Other sequelae of cerebral infarction: Secondary | ICD-10-CM | POA: Diagnosis not present

## 2018-03-07 DIAGNOSIS — I679 Cerebrovascular disease, unspecified: Secondary | ICD-10-CM | POA: Diagnosis not present

## 2018-03-07 DIAGNOSIS — R232 Flushing: Secondary | ICD-10-CM | POA: Diagnosis not present

## 2018-03-07 DIAGNOSIS — R269 Unspecified abnormalities of gait and mobility: Secondary | ICD-10-CM | POA: Diagnosis not present

## 2018-03-07 DIAGNOSIS — I639 Cerebral infarction, unspecified: Secondary | ICD-10-CM | POA: Diagnosis not present

## 2018-03-21 DIAGNOSIS — M8580 Other specified disorders of bone density and structure, unspecified site: Secondary | ICD-10-CM | POA: Diagnosis not present

## 2018-03-21 DIAGNOSIS — M25551 Pain in right hip: Secondary | ICD-10-CM | POA: Diagnosis not present

## 2018-03-21 DIAGNOSIS — M7601 Gluteal tendinitis, right hip: Secondary | ICD-10-CM | POA: Diagnosis not present

## 2018-04-13 DIAGNOSIS — H401131 Primary open-angle glaucoma, bilateral, mild stage: Secondary | ICD-10-CM | POA: Diagnosis not present

## 2018-04-16 DIAGNOSIS — Z23 Encounter for immunization: Secondary | ICD-10-CM | POA: Diagnosis not present

## 2018-05-23 DIAGNOSIS — R9431 Abnormal electrocardiogram [ECG] [EKG]: Secondary | ICD-10-CM | POA: Diagnosis not present

## 2018-05-23 DIAGNOSIS — Z87891 Personal history of nicotine dependence: Secondary | ICD-10-CM | POA: Diagnosis not present

## 2018-05-23 DIAGNOSIS — E7800 Pure hypercholesterolemia, unspecified: Secondary | ICD-10-CM | POA: Diagnosis not present

## 2018-05-23 DIAGNOSIS — Z1231 Encounter for screening mammogram for malignant neoplasm of breast: Secondary | ICD-10-CM | POA: Diagnosis not present

## 2018-05-23 DIAGNOSIS — Z13220 Encounter for screening for lipoid disorders: Secondary | ICD-10-CM | POA: Diagnosis not present

## 2018-05-23 DIAGNOSIS — Z79899 Other long term (current) drug therapy: Secondary | ICD-10-CM | POA: Diagnosis not present

## 2018-05-23 DIAGNOSIS — Z136 Encounter for screening for cardiovascular disorders: Secondary | ICD-10-CM | POA: Diagnosis not present

## 2018-05-23 DIAGNOSIS — I771 Stricture of artery: Secondary | ICD-10-CM | POA: Diagnosis not present

## 2018-05-23 DIAGNOSIS — D126 Benign neoplasm of colon, unspecified: Secondary | ICD-10-CM | POA: Diagnosis not present

## 2018-05-23 DIAGNOSIS — I119 Hypertensive heart disease without heart failure: Secondary | ICD-10-CM | POA: Diagnosis not present

## 2018-05-23 DIAGNOSIS — I517 Cardiomegaly: Secondary | ICD-10-CM | POA: Diagnosis not present

## 2018-05-23 DIAGNOSIS — Z7689 Persons encountering health services in other specified circumstances: Secondary | ICD-10-CM | POA: Diagnosis not present

## 2018-05-23 DIAGNOSIS — Z8673 Personal history of transient ischemic attack (TIA), and cerebral infarction without residual deficits: Secondary | ICD-10-CM | POA: Diagnosis not present

## 2018-05-23 DIAGNOSIS — R7303 Prediabetes: Secondary | ICD-10-CM | POA: Diagnosis not present

## 2018-05-23 DIAGNOSIS — Z780 Asymptomatic menopausal state: Secondary | ICD-10-CM | POA: Diagnosis not present

## 2018-05-23 DIAGNOSIS — R2689 Other abnormalities of gait and mobility: Secondary | ICD-10-CM | POA: Diagnosis not present

## 2018-05-23 DIAGNOSIS — R011 Cardiac murmur, unspecified: Secondary | ICD-10-CM | POA: Diagnosis not present

## 2018-05-23 DIAGNOSIS — Z0000 Encounter for general adult medical examination without abnormal findings: Secondary | ICD-10-CM | POA: Diagnosis not present

## 2018-05-23 DIAGNOSIS — M810 Age-related osteoporosis without current pathological fracture: Secondary | ICD-10-CM | POA: Diagnosis not present

## 2018-05-23 DIAGNOSIS — E041 Nontoxic single thyroid nodule: Secondary | ICD-10-CM | POA: Diagnosis not present

## 2018-05-23 DIAGNOSIS — Z1239 Encounter for other screening for malignant neoplasm of breast: Secondary | ICD-10-CM | POA: Diagnosis not present

## 2018-05-23 DIAGNOSIS — F439 Reaction to severe stress, unspecified: Secondary | ICD-10-CM | POA: Diagnosis not present

## 2018-05-23 DIAGNOSIS — Z85828 Personal history of other malignant neoplasm of skin: Secondary | ICD-10-CM | POA: Diagnosis not present

## 2018-05-23 DIAGNOSIS — R931 Abnormal findings on diagnostic imaging of heart and coronary circulation: Secondary | ICD-10-CM | POA: Diagnosis not present

## 2018-05-23 DIAGNOSIS — M8000XA Age-related osteoporosis with current pathological fracture, unspecified site, initial encounter for fracture: Secondary | ICD-10-CM | POA: Diagnosis not present

## 2018-05-23 DIAGNOSIS — C4492 Squamous cell carcinoma of skin, unspecified: Secondary | ICD-10-CM | POA: Diagnosis not present

## 2018-06-13 DIAGNOSIS — L820 Inflamed seborrheic keratosis: Secondary | ICD-10-CM | POA: Diagnosis not present

## 2018-06-13 DIAGNOSIS — L578 Other skin changes due to chronic exposure to nonionizing radiation: Secondary | ICD-10-CM | POA: Diagnosis not present

## 2018-06-13 DIAGNOSIS — Z85828 Personal history of other malignant neoplasm of skin: Secondary | ICD-10-CM | POA: Diagnosis not present

## 2018-06-15 DIAGNOSIS — R260 Ataxic gait: Secondary | ICD-10-CM | POA: Diagnosis not present

## 2018-07-04 DIAGNOSIS — R260 Ataxic gait: Secondary | ICD-10-CM | POA: Diagnosis not present

## 2018-07-06 DIAGNOSIS — R260 Ataxic gait: Secondary | ICD-10-CM | POA: Diagnosis not present

## 2018-07-11 DIAGNOSIS — R260 Ataxic gait: Secondary | ICD-10-CM | POA: Diagnosis not present

## 2018-07-14 DIAGNOSIS — R260 Ataxic gait: Secondary | ICD-10-CM | POA: Diagnosis not present

## 2018-07-20 DIAGNOSIS — R260 Ataxic gait: Secondary | ICD-10-CM | POA: Diagnosis not present

## 2018-07-24 DIAGNOSIS — R260 Ataxic gait: Secondary | ICD-10-CM | POA: Diagnosis not present

## 2018-08-01 DIAGNOSIS — R260 Ataxic gait: Secondary | ICD-10-CM | POA: Diagnosis not present

## 2018-08-03 DIAGNOSIS — J019 Acute sinusitis, unspecified: Secondary | ICD-10-CM | POA: Diagnosis not present

## 2018-08-03 DIAGNOSIS — D1809 Hemangioma of other sites: Secondary | ICD-10-CM | POA: Diagnosis not present

## 2018-08-03 DIAGNOSIS — R260 Ataxic gait: Secondary | ICD-10-CM | POA: Diagnosis not present

## 2018-08-08 DIAGNOSIS — R260 Ataxic gait: Secondary | ICD-10-CM | POA: Diagnosis not present

## 2018-08-10 DIAGNOSIS — R260 Ataxic gait: Secondary | ICD-10-CM | POA: Diagnosis not present

## 2018-08-15 DIAGNOSIS — R260 Ataxic gait: Secondary | ICD-10-CM | POA: Diagnosis not present

## 2018-08-15 DIAGNOSIS — G8194 Hemiplegia, unspecified affecting left nondominant side: Secondary | ICD-10-CM | POA: Diagnosis not present

## 2018-08-17 DIAGNOSIS — G8194 Hemiplegia, unspecified affecting left nondominant side: Secondary | ICD-10-CM | POA: Diagnosis not present

## 2018-08-17 DIAGNOSIS — R260 Ataxic gait: Secondary | ICD-10-CM | POA: Diagnosis not present

## 2018-10-24 DIAGNOSIS — Q279 Congenital malformation of peripheral vascular system, unspecified: Secondary | ICD-10-CM | POA: Diagnosis not present

## 2018-12-20 DIAGNOSIS — H401131 Primary open-angle glaucoma, bilateral, mild stage: Secondary | ICD-10-CM | POA: Diagnosis not present

## 2018-12-20 DIAGNOSIS — H0288A Meibomian gland dysfunction right eye, upper and lower eyelids: Secondary | ICD-10-CM | POA: Diagnosis not present

## 2018-12-20 DIAGNOSIS — H0288B Meibomian gland dysfunction left eye, upper and lower eyelids: Secondary | ICD-10-CM | POA: Diagnosis not present

## 2019-03-15 DIAGNOSIS — H903 Sensorineural hearing loss, bilateral: Secondary | ICD-10-CM | POA: Diagnosis not present

## 2019-04-21 DIAGNOSIS — Z23 Encounter for immunization: Secondary | ICD-10-CM | POA: Diagnosis not present

## 2019-06-27 ENCOUNTER — Ambulatory Visit: Payer: Medicare Other | Attending: Internal Medicine

## 2019-06-27 DIAGNOSIS — Z23 Encounter for immunization: Secondary | ICD-10-CM

## 2019-06-27 NOTE — Progress Notes (Signed)
   Covid-19 Vaccination Clinic  Name:  ALEILA HODGINS    MRN: [id number] DOB: 03-13-1941  06/27/2019  Ms. Radle was observed post Covid-19 immunization for 15 minutes without incidence. She was provided with Vaccine Information Sheet and instruction to access the V-Safe system.   Ms. Mulkey was instructed to call 911 with any severe reactions post vaccine: Marland Kitchen Difficulty breathing  . Swelling of your face and throat  . A fast heartbeat  . A bad rash all over your body  . Dizziness and weakness    Immunizations Administered    Name Date Dose VIS Date Route   Pfizer COVID-19 Vaccine 06/27/2019 12:02 PM 0.3 mL 05/18/2019 Intramuscular   Manufacturer: [place]   Lot: [id number]   [place]: [id number]

## 2019-07-18 ENCOUNTER — Ambulatory Visit: Payer: Medicare Other | Attending: Internal Medicine

## 2019-07-18 DIAGNOSIS — Z23 Encounter for immunization: Secondary | ICD-10-CM

## 2019-07-18 NOTE — Progress Notes (Signed)
   Covid-19 Vaccination Clinic  Name:  Vanessa Ford    MRN: [id number] DOB: 27-Aug-1940  07/18/2019  [name] was observed post Covid-19 immunization for 15 minutes without incidence. She was provided with Vaccine Information Sheet and instruction to access the V-Safe system.   [name] was instructed to call 911 with any severe reactions post vaccine: Marland Kitchen Difficulty breathing  . Swelling of your face and throat  . A fast heartbeat  . A bad rash all over your body  . Dizziness and weakness    Immunizations Administered    Name Date Dose VIS Date Route   Pfizer COVID-19 Vaccine 07/18/2019  5:54 PM 0.3 mL 05/18/2019 Intramuscular   Manufacturer: [place]   Lot: [id number]   [place]: [id number]
